# Patient Record
Sex: Female | Born: 1945 | Race: White | Hispanic: No | Marital: Married | Smoking: Former smoker
Health system: Southern US, Community
[De-identification: ages and names within clinical notes are randomized; demographics above are authoritative.]

## PROBLEM LIST (undated history)

## (undated) DIAGNOSIS — C4490 Unspecified malignant neoplasm of skin, unspecified: Secondary | ICD-10-CM

## (undated) DIAGNOSIS — I679 Cerebrovascular disease, unspecified: Secondary | ICD-10-CM

## (undated) DIAGNOSIS — I639 Cerebral infarction, unspecified: Secondary | ICD-10-CM

## (undated) DIAGNOSIS — E785 Hyperlipidemia, unspecified: Secondary | ICD-10-CM

## (undated) DIAGNOSIS — J449 Chronic obstructive pulmonary disease, unspecified: Secondary | ICD-10-CM

## (undated) DIAGNOSIS — I10 Essential (primary) hypertension: Secondary | ICD-10-CM

## (undated) DIAGNOSIS — F329 Major depressive disorder, single episode, unspecified: Secondary | ICD-10-CM

## (undated) DIAGNOSIS — M1990 Unspecified osteoarthritis, unspecified site: Secondary | ICD-10-CM

## (undated) DIAGNOSIS — C3491 Malignant neoplasm of unspecified part of right bronchus or lung: Secondary | ICD-10-CM

## (undated) DIAGNOSIS — F419 Anxiety disorder, unspecified: Secondary | ICD-10-CM

## (undated) DIAGNOSIS — J45909 Unspecified asthma, uncomplicated: Secondary | ICD-10-CM

## (undated) DIAGNOSIS — F32A Depression, unspecified: Secondary | ICD-10-CM

## (undated) HISTORY — DX: Major depressive disorder, single episode, unspecified: F32.9

## (undated) HISTORY — DX: Unspecified osteoarthritis, unspecified site: M19.90

## (undated) HISTORY — DX: Anxiety disorder, unspecified: F41.9

## (undated) HISTORY — DX: Unspecified asthma, uncomplicated: J45.909

## (undated) HISTORY — DX: Depression, unspecified: F32.A

## (undated) HISTORY — DX: Chronic obstructive pulmonary disease, unspecified: J44.9

## (undated) HISTORY — DX: Essential (primary) hypertension: I10

## (undated) HISTORY — PX: TUBAL LIGATION: SHX77

## (undated) HISTORY — PX: HAND SURGERY: SHX662

## (undated) HISTORY — DX: Unspecified malignant neoplasm of skin, unspecified: C44.90

## (undated) HISTORY — DX: Cerebrovascular disease, unspecified: I67.9

## (undated) HISTORY — DX: Hyperlipidemia, unspecified: E78.5

## (undated) HISTORY — PX: ABDOMINAL HYSTERECTOMY: SHX81

## (undated) HISTORY — DX: Malignant neoplasm of unspecified part of right bronchus or lung: C34.91

---

## 2017-02-15 HISTORY — PX: OTHER SURGICAL HISTORY: SHX169

## 2017-02-22 DIAGNOSIS — F17218 Nicotine dependence, cigarettes, with other nicotine-induced disorders: Secondary | ICD-10-CM | POA: Diagnosis not present

## 2017-02-22 DIAGNOSIS — Z801 Family history of malignant neoplasm of trachea, bronchus and lung: Secondary | ICD-10-CM | POA: Diagnosis not present

## 2017-02-22 DIAGNOSIS — C3491 Malignant neoplasm of unspecified part of right bronchus or lung: Secondary | ICD-10-CM | POA: Diagnosis not present

## 2017-02-22 DIAGNOSIS — Z808 Family history of malignant neoplasm of other organs or systems: Secondary | ICD-10-CM | POA: Diagnosis not present

## 2017-02-22 DIAGNOSIS — Z800 Family history of malignant neoplasm of digestive organs: Secondary | ICD-10-CM | POA: Diagnosis not present

## 2017-03-08 DIAGNOSIS — G893 Neoplasm related pain (acute) (chronic): Secondary | ICD-10-CM | POA: Diagnosis not present

## 2017-03-08 DIAGNOSIS — C3411 Malignant neoplasm of upper lobe, right bronchus or lung: Secondary | ICD-10-CM | POA: Diagnosis not present

## 2017-03-23 DIAGNOSIS — E860 Dehydration: Secondary | ICD-10-CM | POA: Diagnosis not present

## 2017-03-23 DIAGNOSIS — C3411 Malignant neoplasm of upper lobe, right bronchus or lung: Secondary | ICD-10-CM | POA: Diagnosis not present

## 2017-04-06 DIAGNOSIS — C3411 Malignant neoplasm of upper lobe, right bronchus or lung: Secondary | ICD-10-CM | POA: Diagnosis not present

## 2017-05-24 DIAGNOSIS — C3411 Malignant neoplasm of upper lobe, right bronchus or lung: Secondary | ICD-10-CM | POA: Diagnosis not present

## 2017-06-01 DIAGNOSIS — C3491 Malignant neoplasm of unspecified part of right bronchus or lung: Secondary | ICD-10-CM | POA: Insufficient documentation

## 2017-06-02 ENCOUNTER — Other Ambulatory Visit: Payer: Self-pay | Admitting: *Deleted

## 2017-06-02 ENCOUNTER — Encounter: Payer: Self-pay | Admitting: Thoracic Surgery (Cardiothoracic Vascular Surgery)

## 2017-06-02 ENCOUNTER — Institutional Professional Consult (permissible substitution): Payer: Medicare HMO | Admitting: Thoracic Surgery (Cardiothoracic Vascular Surgery)

## 2017-06-02 VITALS — BP 180/98 | HR 85 | Resp 20 | Ht 68.0 in | Wt 190.0 lb

## 2017-06-02 DIAGNOSIS — C3491 Malignant neoplasm of unspecified part of right bronchus or lung: Secondary | ICD-10-CM | POA: Diagnosis not present

## 2017-06-02 DIAGNOSIS — C3411 Malignant neoplasm of upper lobe, right bronchus or lung: Secondary | ICD-10-CM

## 2017-06-02 NOTE — Progress Notes (Signed)
PCP is [name], NP Referring Provider is Marice Potter, MD  Chief Complaint  Patient presents with  . Lung Cancer    Surgical eval, Chest CT 01/06/2017,PET Scan 02/02/17, Lung BX 02/15/2017, Head CT 10/26/18PFT's 12/23/2016, Completed Chemo/Radiation      HPI: Krista Grimes is sent for consideration for surgical resection.  Krista Grimes is a 72 year old woman with a 100-pack-year history of tobacco abuse (2 packs/day from age 55 until about 6 months ago), COPD, prior stroke, arthritis, hypertension, hyperlipidemia, skin cancer, anxiety, depression and asthma.  She presented with a complaint of right chest pain radiating into the right arm back in August.  CT of the chest showed a 5 cm right upper lobe mass adjacent to the chest wall without bony destruction.  PET/CT showed this mass was hypermetabolic with an SUV of 21.  There is no evidence of regional or distant metastases.  Biopsy demonstrated squamous cell carcinoma.  She was treated with concurrent chemoradiation by Drs. Bobby Rumpf and [place] at [facility].  She tolerated treatment well without any major side effects.  She had resolution of her pain.  Follow-up CT showed a significant response to treatment.  She has a history of a stroke.  She has expressive aphasia.  She has some limitation of movement in her right hand.  She is able to ambulate without difficulty.  She denies chest pain, pressure, or tightness.  She has no history of cardiac disease.  She does have coronary atherosclerosis on her scans.  She does not get short of breath with routine activities and could walk a flight of steps without stopping.  She has not had any change in appetite or weight loss.  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  She is well aware of the pain that is possible with this type of surgery.  Zubrod Score: At the time of surgery this  patient's most appropriate activity status/level should be described as: []     0    Normal activity, no symptoms [x]     1    Restricted in physical strenuous activity but ambulatory, able to do out light work []     2    Ambulatory and capable of self care, unable to do work activities, up and about >50 % of waking hours                              []     3    Only limited self care, in bed greater than 50% of waking hours []     4    Completely disabled, no self care, confined to bed or chair []     5    Moribund    Past Medical History:  Diagnosis Date  . Anxiety   . Arthritis   . Asthma   . Cerebrovascular disease   . COPD (chronic obstructive pulmonary disease) ([place])   . Depression   . Hyperlipidemia   . Hypertension   . Skin cancer   . 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 Colon cancer Mother   . Lung cancer Father   . COPD Father   . Lung cancer Brother   . Skin cancer Brother     Social History Social  History   Tobacco Use  . Smoking status: Former Smoker    Types: Cigarettes    Last attempt to quit: 04/01/2017    Years since quitting: 0.1  Substance Use Topics  . Alcohol use: No    Frequency: Never  . Drug use: No    Current Outpatient Medications  Medication Sig Dispense Refill  . albuterol (PROVENTIL HFA;VENTOLIN HFA) 108 (90 Base) MCG/ACT inhaler Inhale into the lungs every 6 (six) hours as needed for wheezing or shortness of breath.    Marland Kitchen atorvastatin (LIPITOR) 40 MG tablet Take 40 mg by mouth daily at 6 PM.    . cholecalciferol (VITAMIN D) 1000 units tablet Take 1,000 Units by mouth daily.    . citalopram (CELEXA) 20 MG tablet Take 20 mg by mouth daily.    . clopidogrel (PLAVIX) 75 MG tablet Take 75 mg by mouth daily.    Marland Kitchen lisinopril (PRINIVIL,ZESTRIL) 30 MG tablet Take 30 mg by mouth daily.    Marland Kitchen morphine (MSIR) 15 MG tablet Take 15 mg by mouth every 6 (six) hours as needed for  severe pain.    . Omega 3 1000 MG CAPS Take by mouth daily.    Marland Kitchen oxyCODONE (OXY IR/ROXICODONE) 5 MG immediate release tablet Take 5 mg by mouth every 4 (four) hours as needed for severe pain.     No current facility-administered medications for this visit.     Allergies  Allergen Reactions  . Codeine Nausea And Vomiting    Review of Systems  Constitutional: Negative for activity change, appetite change and unexpected weight change.  HENT: Positive for dental problem (dentures).   Eyes: Negative for visual disturbance.  Respiratory: Negative for cough, shortness of breath and wheezing.   Cardiovascular: Negative for chest pain, palpitations and leg swelling.  Gastrointestinal: Negative for abdominal distention, abdominal pain and blood in stool.  Genitourinary: Negative for difficulty urinating and dysuria.  Musculoskeletal: Positive for arthralgias and joint swelling.       Right chest wall, shoulder, arm pain resolved with XRT  Neurological: Positive for speech difficulty and weakness (right hand). Negative for seizures.       Memory problems  Hematological: Negative for adenopathy. Bruises/bleeds easily.  All other systems reviewed and are negative.   BP (!) 180/98   Pulse 85   Resp 20   Ht 5\' 8"  (1.727 m)   Wt 190 lb (86.2 kg)   SpO2 97% Comment: RA  BMI 28.89 kg/m  Physical Exam  Constitutional: She is oriented to person, place, and time. She appears well-developed and well-nourished. No distress.  HENT:  Head: Normocephalic and atraumatic.  Mouth/Throat: No oropharyngeal exudate.  Eyes: Conjunctivae and EOM are normal. No scleral icterus.  Neck: Neck supple. No thyromegaly present.  Cardiovascular: Normal rate, regular rhythm and normal heart sounds. Exam reveals no gallop and no friction rub.  No murmur heard. Pulmonary/Chest: Effort normal and breath sounds normal. No respiratory distress. She has no wheezes. She has no rales.  Abdominal: Soft. She exhibits no  distension. There is no tenderness.  Musculoskeletal: She exhibits deformity (right hand, unable to close grip).  Neurological: She is alert and oriented to person, place, and time. No cranial nerve deficit. She exhibits abnormal muscle tone (right hand).  Expressive aphasia  Skin: Skin is warm and dry.  Vitals reviewed.  Diagnostic Tests: CT chest [facility] 01/06/2017 Impression 1.  5 cm pleural-parenchymal lesion right apex with cavitary component inferiorly.  Imaging features suspicious for malignancy.  PET/CT recommended to further evaluate.  ` PET/CT 02/03/2018 Impression 1.  Hypermetabolism corresponding to the partially cavitary right apical lung mass.  This is highly suspicious for primary bronchogenic carcinoma.  Given location, cavitary component, and surrounding groundglass opacity, infection (including mycobacterial) cannot be excluded but is ___less likely. 2.  No evidence of thoracic nodal or extrathoracic hypermetabolic metastasis. 3.  Coronary atherosclerosis.  Aortic atherosclerosis. 4.  Pulmonary artery enlargement suggests pulmonary arterial hypertension.  CT chest [facility] 05/21/2017 Impression 1.  Response to therapy of right apical lung lesion. 2.  No thoracic adenopathy or evidence of progressive disease. 3.  Coronary atherosclerosis.  Aortic atherosclerosis. 4.  Emphysema  I personally reviewed the CT and PET/CT images and concur with the official reports as summarized above.  Pulmonary function testing  FVC 2.74 (78%) FEV1 2.04 (78%) FEV1 1.98 (70%) postbronchodilator   Impression: Mrs. Haislip is a 72 year old woman with a history of tobacco abuse, COPD, asthma, stroke, hypertension, hyperlipidemia, anxiety, depression, and skin cancer.  She presented several months ago with right chest pain radiating into her right arm.  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  There was no bony destruction.   I discussed the case with Dr. Bobby Rumpf at that time.  We felt like her best chance was with neoadjuvant chemoradiation followed by surgical resection.  She has had a good response to treatment.  I informed the patient and her family that there is a possibility that she might not need any for cure.  However, more often than not if we stop at this point she would have recurrence.  Surgical resection improves, but by no means guarantees, her odds of survival.  She would require a right upper lobectomy.  In all likelihood, we will need to do a thoracotomy and chest wall resection.  I discussed the general nature of the procedure including the incisions to be used, the need for general anesthesia, the use of drainage tubes postoperatively, the expected hospital stay, and the overall recovery.  They understand that chronic pain is a very strong possibility.  I informed him of the indications, risks, benefits, and alternatives.  They understand the risk include, but are not limited to death, MI, stroke, DVT, PE, bleeding, possible need for transfusion, infection, prolonged air leak, cardiac arrhythmias, chronic pain, respiratory failure, as well as the possibility of other unforeseeable complications.    She understands and accepts the risks and wishes to proceed.  Coronary atherosclerosis-multiple cardiac risk factors including hypertension, hyperlipidemia, and tobacco abuse.  She does not have any anginal symptoms.  There is no indication for further cardiac workup prior to surgical resection.  She is at higher than normal risk for cardiac complications.  Stroke/expressive aphasia-higher than normal risk for cerebrovascular complications.  She is on Plavix.  She will need to be off that for 5 days prior to surgery.  She has had an MR of the brain which showed no evidence of metastatic disease.  Plan: Right thoracotomy, right upper lobectomy, possible chest wall resection on Thursday, 06/24/2017  Stop Plavix on  06/19/2017  [name], MD [facility] [phone]

## 2017-06-02 NOTE — H&P (View-Only) (Signed)
PCP is [name], NP Referring Provider is Marice Potter, MD  Chief Complaint  Patient presents with  . Lung Cancer    Surgical eval, Chest CT 01/06/2017,PET Scan 02/02/17, Lung BX 02/15/2017, Head CT 10/26/18PFT's 12/23/2016, Completed Chemo/Radiation      HPI: Krista Grimes is sent for consideration for surgical resection.  Krista Grimes is a 72 year old woman with a 100-pack-year history of tobacco abuse (2 packs/day from age 81 until about 6 months ago), COPD, prior stroke, arthritis, hypertension, hyperlipidemia, skin cancer, anxiety, depression and asthma.  She presented with a complaint of right chest pain radiating into the right arm back in August.  CT of the chest showed a 5 cm right upper lobe mass adjacent to the chest wall without bony destruction.  PET/CT showed this mass was hypermetabolic with an SUV of 21.  There is no evidence of regional or distant metastases.  Biopsy demonstrated squamous cell carcinoma.  She was treated with concurrent chemoradiation by Drs. Bobby Rumpf and Baxter at [facility].  She tolerated treatment well without any major side effects.  She had resolution of her pain.  Follow-up CT showed a significant response to treatment.  She has a history of a stroke.  She has expressive aphasia.  She has some limitation of movement in her right hand.  She is able to ambulate without difficulty.  She denies chest pain, pressure, or tightness.  She has no history of cardiac disease.  She does have coronary atherosclerosis on her scans.  She does not get short of breath with routine activities and could walk a flight of steps without stopping.  She has not had any change in appetite or weight loss.  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  She is well aware of the pain that is possible with this type of surgery.  Zubrod Score: At the time of surgery this  patient's most appropriate activity status/level should be described as: []     0    Normal activity, no symptoms [x]     1    Restricted in physical strenuous activity but ambulatory, able to do out light work []     2    Ambulatory and capable of self care, unable to do work activities, up and about >50 % of waking hours                              []     3    Only limited self care, in bed greater than 50% of waking hours []     4    Completely disabled, no self care, confined to bed or chair []     5    Moribund    Past Medical History:  Diagnosis Date  . Anxiety   . Arthritis   . Asthma   . Cerebrovascular disease   . COPD (chronic obstructive pulmonary disease) ([place])   . Depression   . Hyperlipidemia   . Hypertension   . Skin cancer   . 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 Colon cancer Mother   . Lung cancer Father   . COPD Father   . Lung cancer Brother   . Skin cancer Brother     Social History Social  History   Tobacco Use  . Smoking status: Former Smoker    Types: Cigarettes    Last attempt to quit: 04/01/2017    Years since quitting: 0.1  Substance Use Topics  . Alcohol use: No    Frequency: Never  . Drug use: No    Current Outpatient Medications  Medication Sig Dispense Refill  . albuterol (PROVENTIL HFA;VENTOLIN HFA) 108 (90 Base) MCG/ACT inhaler Inhale into the lungs every 6 (six) hours as needed for wheezing or shortness of breath.    Marland Kitchen atorvastatin (LIPITOR) 40 MG tablet Take 40 mg by mouth daily at 6 PM.    . cholecalciferol (VITAMIN D) 1000 units tablet Take 1,000 Units by mouth daily.    . citalopram (CELEXA) 20 MG tablet Take 20 mg by mouth daily.    . clopidogrel (PLAVIX) 75 MG tablet Take 75 mg by mouth daily.    Marland Kitchen lisinopril (PRINIVIL,ZESTRIL) 30 MG tablet Take 30 mg by mouth daily.    Marland Kitchen morphine (MSIR) 15 MG tablet Take 15 mg by mouth every 6 (six) hours as needed for  severe pain.    . Omega 3 1000 MG CAPS Take by mouth daily.    Marland Kitchen oxyCODONE (OXY IR/ROXICODONE) 5 MG immediate release tablet Take 5 mg by mouth every 4 (four) hours as needed for severe pain.     No current facility-administered medications for this visit.     Allergies  Allergen Reactions  . Codeine Nausea And Vomiting    Review of Systems  Constitutional: Negative for activity change, appetite change and unexpected weight change.  HENT: Positive for dental problem (dentures).   Eyes: Negative for visual disturbance.  Respiratory: Negative for cough, shortness of breath and wheezing.   Cardiovascular: Negative for chest pain, palpitations and leg swelling.  Gastrointestinal: Negative for abdominal distention, abdominal pain and blood in stool.  Genitourinary: Negative for difficulty urinating and dysuria.  Musculoskeletal: Positive for arthralgias and joint swelling.       Right chest wall, shoulder, arm pain resolved with XRT  Neurological: Positive for speech difficulty and weakness (right hand). Negative for seizures.       Memory problems  Hematological: Negative for adenopathy. Bruises/bleeds easily.  All other systems reviewed and are negative.   BP (!) 180/98   Pulse 85   Resp 20   Ht 5\' 8"  (1.727 m)   Wt 190 lb (86.2 kg)   SpO2 97% Comment: RA  BMI 28.89 kg/m  Physical Exam  Constitutional: She is oriented to person, place, and time. She appears well-developed and well-nourished. No distress.  HENT:  Head: Normocephalic and atraumatic.  Mouth/Throat: No oropharyngeal exudate.  Eyes: Conjunctivae and EOM are normal. No scleral icterus.  Neck: Neck supple. No thyromegaly present.  Cardiovascular: Normal rate, regular rhythm and normal heart sounds. Exam reveals no gallop and no friction rub.  No murmur heard. Pulmonary/Chest: Effort normal and breath sounds normal. No respiratory distress. She has no wheezes. She has no rales.  Abdominal: Soft. She exhibits no  distension. There is no tenderness.  Musculoskeletal: She exhibits deformity (right hand, unable to close grip).  Neurological: She is alert and oriented to person, place, and time. No cranial nerve deficit. She exhibits abnormal muscle tone (right hand).  Expressive aphasia  Skin: Skin is warm and dry.  Vitals reviewed.  Diagnostic Tests: CT chest [facility] 01/06/2017 Impression 1.  5 cm pleural-parenchymal lesion right apex with cavitary component inferiorly.  Imaging features suspicious for malignancy.  PET/CT recommended to further evaluate.  ` PET/CT 02/03/2018 Impression 1.  Hypermetabolism corresponding to the partially cavitary right apical lung mass.  This is highly suspicious for primary bronchogenic carcinoma.  Given location, cavitary component, and surrounding groundglass opacity, infection (including mycobacterial) cannot be excluded but is ___less likely. 2.  No evidence of thoracic nodal or extrathoracic hypermetabolic metastasis. 3.  Coronary atherosclerosis.  Aortic atherosclerosis. 4.  Pulmonary artery enlargement suggests pulmonary arterial hypertension.  CT chest [facility] 05/21/2017 Impression 1.  Response to therapy of right apical lung lesion. 2.  No thoracic adenopathy or evidence of progressive disease. 3.  Coronary atherosclerosis.  Aortic atherosclerosis. 4.  Emphysema  I personally reviewed the CT and PET/CT images and concur with the official reports as summarized above.  Pulmonary function testing  FVC 2.74 (78%) FEV1 2.04 (78%) FEV1 1.98 (70%) postbronchodilator   Impression: Krista Grimes is a 72 year old woman with a history of tobacco abuse, COPD, asthma, stroke, hypertension, hyperlipidemia, anxiety, depression, and skin cancer.  She presented several months ago with right chest pain radiating into her right arm.  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  There was no bony destruction.   I discussed the case with Dr. Bobby Rumpf at that time.  We felt like her best chance was with neoadjuvant chemoradiation followed by surgical resection.  She has had a good response to treatment.  I informed the patient and her family that there is a possibility that she might not need any for cure.  However, more often than not if we stop at this point she would have recurrence.  Surgical resection improves, but by no means guarantees, her odds of survival.  She would require a right upper lobectomy.  In all likelihood, we will need to do a thoracotomy and chest wall resection.  I discussed the general nature of the procedure including the incisions to be used, the need for general anesthesia, the use of drainage tubes postoperatively, the expected hospital stay, and the overall recovery.  They understand that chronic pain is a very strong possibility.  I informed him of the indications, risks, benefits, and alternatives.  They understand the risk include, but are not limited to death, MI, stroke, DVT, PE, bleeding, possible need for transfusion, infection, prolonged air leak, cardiac arrhythmias, chronic pain, respiratory failure, as well as the possibility of other unforeseeable complications.    She understands and accepts the risks and wishes to proceed.  Coronary atherosclerosis-multiple cardiac risk factors including hypertension, hyperlipidemia, and tobacco abuse.  She does not have any anginal symptoms.  There is no indication for further cardiac workup prior to surgical resection.  She is at higher than normal risk for cardiac complications.  Stroke/expressive aphasia-higher than normal risk for cerebrovascular complications.  She is on Plavix.  She will need to be off that for 5 days prior to surgery.  She has had an MR of the brain which showed no evidence of metastatic disease.  Plan: Right thoracotomy, right upper lobectomy, possible chest wall resection on Thursday, 06/24/2017  Stop Plavix on  06/19/2017  [name], MD [facility] [phone]

## 2017-06-14 LAB — PULMONARY FUNCTION TEST

## 2017-06-22 ENCOUNTER — Encounter (HOSPITAL_COMMUNITY)
Admission: RE | Admit: 2017-06-22 | Discharge: 2017-06-22 | Disposition: A | Discharge status: Home / Self Care | Payer: Medicare HMO | Source: Ambulatory Visit | Attending: Thoracic Surgery (Cardiothoracic Vascular Surgery) | Admitting: Thoracic Surgery (Cardiothoracic Vascular Surgery)

## 2017-06-22 ENCOUNTER — Ambulatory Visit (HOSPITAL_COMMUNITY)
Admission: RE | Admit: 2017-06-22 | Discharge: 2017-06-22 | Disposition: A | Discharge status: Home / Self Care | Payer: Medicare HMO | Source: Ambulatory Visit | Attending: Thoracic Surgery (Cardiothoracic Vascular Surgery) | Admitting: Thoracic Surgery (Cardiothoracic Vascular Surgery)

## 2017-06-22 ENCOUNTER — Encounter (HOSPITAL_COMMUNITY): Payer: Self-pay | Admitting: *Deleted

## 2017-06-22 DIAGNOSIS — C3411 Malignant neoplasm of upper lobe, right bronchus or lung: Secondary | ICD-10-CM | POA: Insufficient documentation

## 2017-06-22 DIAGNOSIS — Z01818 Encounter for other preprocedural examination: Secondary | ICD-10-CM | POA: Insufficient documentation

## 2017-06-22 DIAGNOSIS — I700 Atherosclerosis of aorta: Secondary | ICD-10-CM

## 2017-06-22 DIAGNOSIS — J439 Emphysema, unspecified: Secondary | ICD-10-CM

## 2017-06-22 HISTORY — DX: Cerebral infarction, unspecified: I63.9

## 2017-06-22 LAB — BLOOD GAS, ARTERIAL
Acid-base deficit: 0.6 mmol/L (ref 0.0–2.0)
Bicarbonate: 23.1 mmol/L (ref 20.0–28.0)
DRAWN BY: 421801
FIO2: 0.21
O2 SAT: 97.8 %
PATIENT TEMPERATURE: 98.6
pCO2 arterial: 35.1 mmHg (ref 32.0–48.0)
pH, Arterial: 7.434 (ref 7.350–7.450)
pO2, Arterial: 103 mmHg (ref 83.0–108.0)

## 2017-06-22 LAB — PROTIME-INR
INR: 1.01
Prothrombin Time: 13.2 seconds (ref 11.4–15.2)

## 2017-06-22 LAB — CBC
HCT: 39.9 % (ref 36.0–46.0)
Hemoglobin: 12.9 g/dL (ref 12.0–15.0)
MCH: 32.1 pg (ref 26.0–34.0)
MCHC: 32.3 g/dL (ref 30.0–36.0)
MCV: 99.3 fL (ref 78.0–100.0)
PLATELETS: 220 10*3/uL (ref 150–400)
RBC: 4.02 MIL/uL (ref 3.87–5.11)
RDW: 14.5 % (ref 11.5–15.5)
WBC: 6.8 10*3/uL (ref 4.0–10.5)

## 2017-06-22 LAB — COMPREHENSIVE METABOLIC PANEL
ALBUMIN: 4.2 g/dL (ref 3.5–5.0)
ALT: 15 U/L (ref 14–54)
AST: 21 U/L (ref 15–41)
Alkaline Phosphatase: 80 U/L (ref 38–126)
Anion gap: 15 (ref 5–15)
BUN: 11 mg/dL (ref 6–20)
CHLORIDE: 108 mmol/L (ref 101–111)
CO2: 19 mmol/L — AB (ref 22–32)
CREATININE: 0.63 mg/dL (ref 0.44–1.00)
Calcium: 9.5 mg/dL (ref 8.9–10.3)
GFR calc non Af Amer: >60 mL/min (ref >60)
Glucose, Bld: 107 mg/dL — ABNORMAL HIGH (ref 65–99)
Potassium: 4 mmol/L (ref 3.5–5.1)
SODIUM: 142 mmol/L (ref 135–145)
Total Bilirubin: 0.8 mg/dL (ref 0.3–1.2)
Total Protein: 7.1 g/dL (ref 6.5–8.1)

## 2017-06-22 LAB — URINALYSIS, ROUTINE W REFLEX MICROSCOPIC
Bilirubin Urine: NEGATIVE
GLUCOSE, UA: NEGATIVE mg/dL
KETONES UR: NEGATIVE mg/dL
LEUKOCYTES UA: NEGATIVE
Nitrite: NEGATIVE
PH: 7 (ref 5.0–8.0)
Protein, ur: NEGATIVE mg/dL
Specific Gravity, Urine: 1.002 — ABNORMAL LOW (ref 1.005–1.030)

## 2017-06-22 LAB — ABO/RH: ABO/RH(D): A POS

## 2017-06-22 LAB — SURGICAL PCR SCREEN
MRSA, PCR: INVALID — AB
STAPHYLOCOCCUS AUREUS: INVALID — AB

## 2017-06-22 LAB — APTT: aPTT: 29 seconds (ref 24–36)

## 2017-06-22 NOTE — Pre-Procedure Instructions (Signed)
    Krista Grimes  06/22/2017      [id number] - [place], [place] - [address] [name] [address] Phone: [phone] Fax: [phone]    Your procedure is scheduled on 06/24/17.  Report to [facility] Admitting at 6 A.M.  Call this number if you have problems the morning of surgery:  [phone]   Remember:  Do not eat food or drink liquids after midnight.  Take these medicines the morning of surgery with A SIP OF WATER --all inhalers,lexapro   Do not wear jewelry, make-up or nail polish.  Do not wear lotions, powders, or perfumes, or deodorant.  Do not shave 48 hours prior to surgery.  Men may shave face and neck.  Do not bring valuables to the hospital.  [facility] is not responsible for any belongings or valuables.  Contacts, dentures or bridgework may not be worn into surgery.  Leave your suitcase in the car.  After surgery it may be brought to your room.  For patients admitted to the hospital, discharge time will be determined by your treatment team.  Patients discharged the day of surgery will not be allowed to drive home.   Name and phone number of your driver:    Special instructions:  Do not take any aspirin,anti-inflammatories,vitamins,or herbal supplements 5-7 days prior to surgery.  Please read over the following fact sheets that you were given. MRSA Information

## 2017-06-23 LAB — MRSA CULTURE: Culture: NOT DETECTED

## 2017-06-24 ENCOUNTER — Inpatient Hospital Stay (HOSPITAL_COMMUNITY): Payer: Medicare HMO

## 2017-06-24 ENCOUNTER — Inpatient Hospital Stay (HOSPITAL_COMMUNITY): Payer: Medicare HMO | Admitting: Anesthesiology

## 2017-06-24 ENCOUNTER — Encounter (HOSPITAL_COMMUNITY)
Admission: RE | Disposition: A | Discharge status: Home / Self Care | Payer: Self-pay | Source: Ambulatory Visit | Attending: Thoracic Surgery (Cardiothoracic Vascular Surgery)

## 2017-06-24 ENCOUNTER — Encounter (HOSPITAL_COMMUNITY): Payer: Self-pay | Admitting: Urology

## 2017-06-24 ENCOUNTER — Inpatient Hospital Stay (HOSPITAL_COMMUNITY)
Admission: RE | Admit: 2017-06-24 | Discharge: 2017-07-01 | DRG: 164 | Disposition: A | Discharge status: Home / Self Care | Payer: Medicare HMO | Source: Ambulatory Visit | Attending: Thoracic Surgery (Cardiothoracic Vascular Surgery) | Admitting: Thoracic Surgery (Cardiothoracic Vascular Surgery)

## 2017-06-24 DIAGNOSIS — J9382 Other air leak: Secondary | ICD-10-CM | POA: Diagnosis not present

## 2017-06-24 DIAGNOSIS — Z9689 Presence of other specified functional implants: Secondary | ICD-10-CM

## 2017-06-24 DIAGNOSIS — I2510 Atherosclerotic heart disease of native coronary artery without angina pectoris: Secondary | ICD-10-CM | POA: Diagnosis present

## 2017-06-24 DIAGNOSIS — I69320 Aphasia following cerebral infarction: Secondary | ICD-10-CM

## 2017-06-24 DIAGNOSIS — Z808 Family history of malignant neoplasm of other organs or systems: Secondary | ICD-10-CM | POA: Diagnosis not present

## 2017-06-24 DIAGNOSIS — E785 Hyperlipidemia, unspecified: Secondary | ICD-10-CM | POA: Diagnosis present

## 2017-06-24 DIAGNOSIS — Z923 Personal history of irradiation: Secondary | ICD-10-CM

## 2017-06-24 DIAGNOSIS — Z85828 Personal history of other malignant neoplasm of skin: Secondary | ICD-10-CM | POA: Diagnosis not present

## 2017-06-24 DIAGNOSIS — Z885 Allergy status to narcotic agent status: Secondary | ICD-10-CM

## 2017-06-24 DIAGNOSIS — I10 Essential (primary) hypertension: Secondary | ICD-10-CM | POA: Diagnosis present

## 2017-06-24 DIAGNOSIS — Z801 Family history of malignant neoplasm of trachea, bronchus and lung: Secondary | ICD-10-CM | POA: Diagnosis not present

## 2017-06-24 DIAGNOSIS — Z9221 Personal history of antineoplastic chemotherapy: Secondary | ICD-10-CM | POA: Diagnosis not present

## 2017-06-24 DIAGNOSIS — Z902 Acquired absence of lung [part of]: Secondary | ICD-10-CM

## 2017-06-24 DIAGNOSIS — Z7902 Long term (current) use of antithrombotics/antiplatelets: Secondary | ICD-10-CM

## 2017-06-24 DIAGNOSIS — Z90710 Acquired absence of both cervix and uterus: Secondary | ICD-10-CM | POA: Diagnosis not present

## 2017-06-24 DIAGNOSIS — F329 Major depressive disorder, single episode, unspecified: Secondary | ICD-10-CM | POA: Diagnosis present

## 2017-06-24 DIAGNOSIS — I480 Paroxysmal atrial fibrillation: Secondary | ICD-10-CM | POA: Diagnosis not present

## 2017-06-24 DIAGNOSIS — R002 Palpitations: Secondary | ICD-10-CM | POA: Diagnosis present

## 2017-06-24 DIAGNOSIS — E876 Hypokalemia: Secondary | ICD-10-CM | POA: Diagnosis present

## 2017-06-24 DIAGNOSIS — J449 Chronic obstructive pulmonary disease, unspecified: Secondary | ICD-10-CM | POA: Diagnosis present

## 2017-06-24 DIAGNOSIS — R339 Retention of urine, unspecified: Secondary | ICD-10-CM | POA: Diagnosis not present

## 2017-06-24 DIAGNOSIS — F17210 Nicotine dependence, cigarettes, uncomplicated: Secondary | ICD-10-CM | POA: Diagnosis present

## 2017-06-24 DIAGNOSIS — C3411 Malignant neoplasm of upper lobe, right bronchus or lung: Principal | ICD-10-CM | POA: Diagnosis present

## 2017-06-24 DIAGNOSIS — Z79899 Other long term (current) drug therapy: Secondary | ICD-10-CM | POA: Diagnosis not present

## 2017-06-24 DIAGNOSIS — J939 Pneumothorax, unspecified: Secondary | ICD-10-CM

## 2017-06-24 DIAGNOSIS — Z8673 Personal history of transient ischemic attack (TIA), and cerebral infarction without residual deficits: Secondary | ICD-10-CM | POA: Diagnosis not present

## 2017-06-24 HISTORY — PX: LOBECTOMY: SHX5089

## 2017-06-24 HISTORY — PX: THORACOTOMY: SHX5074

## 2017-06-24 LAB — POCT I-STAT 7, (LYTES, BLD GAS, ICA,H+H)
ACID-BASE EXCESS: 1 mmol/L (ref 0.0–2.0)
Bicarbonate: 25.4 mmol/L (ref 20.0–28.0)
CALCIUM ION: 1.04 mmol/L — AB (ref 1.15–1.40)
HCT: 27 % — ABNORMAL LOW (ref 36.0–46.0)
HEMOGLOBIN: 9.2 g/dL — AB (ref 12.0–15.0)
O2 SAT: 100 %
PH ART: 7.399 (ref 7.350–7.450)
Potassium: 3 mmol/L — ABNORMAL LOW (ref 3.5–5.1)
SODIUM: 145 mmol/L (ref 135–145)
TCO2: 27 mmol/L (ref 22–32)
pCO2 arterial: 41.1 mmHg (ref 32.0–48.0)
pO2, Arterial: 275 mmHg — ABNORMAL HIGH (ref 83.0–108.0)

## 2017-06-24 LAB — PREPARE RBC (CROSSMATCH)

## 2017-06-24 LAB — GLUCOSE, CAPILLARY
Glucose-Capillary: 219 mg/dL — ABNORMAL HIGH (ref 65–99)
Glucose-Capillary: 191 mg/dL — ABNORMAL HIGH (ref 65–99)

## 2017-06-24 SURGERY — THORACOTOMY, MAJOR
Anesthesia: General | Site: Chest | Laterality: Right

## 2017-06-24 MED ORDER — VANCOMYCIN HCL 1000 MG IV SOLR
INTRAVENOUS | Status: AC
  Filled 2017-06-24: qty 1000

## 2017-06-24 MED ORDER — HYDROMORPHONE HCL 1 MG/ML IJ SOLN: 0.2500 mg | INTRAMUSCULAR | Status: DC | PRN

## 2017-06-24 MED ORDER — OXYCODONE HCL 5 MG/5ML PO SOLN: 5.0000 mg | Freq: Once | ORAL | Status: DC | PRN

## 2017-06-24 MED ORDER — LACTATED RINGERS IV SOLN
INTRAVENOUS | Status: DC | PRN
  Administered 2017-06-24 (×2): via INTRAVENOUS

## 2017-06-24 MED ORDER — ALBUTEROL SULFATE (2.5 MG/3ML) 0.083% IN NEBU: 3.0000 mL | INHALATION_SOLUTION | Freq: Four times a day (QID) | RESPIRATORY_TRACT | Status: DC | PRN

## 2017-06-24 MED ORDER — BUPIVACAINE HCL (PF) 0.5 % IJ SOLN
INTRAMUSCULAR | Status: AC
  Filled 2017-06-24: qty 10

## 2017-06-24 MED ORDER — PROPOFOL 10 MG/ML IV BOLUS
INTRAVENOUS | Status: AC
  Filled 2017-06-24: qty 20

## 2017-06-24 MED ORDER — OXYCODONE HCL 5 MG PO TABS
5.0000 mg | ORAL_TABLET | ORAL | Status: DC | PRN
  Administered 2017-06-25: 5 mg via ORAL
  Administered 2017-06-25 (×2): 10 mg via ORAL
  Administered 2017-06-25: 5 mg via ORAL
  Filled 2017-06-24: qty 1
  Filled 2017-06-24 (×2): qty 2
  Filled 2017-06-24: qty 1

## 2017-06-24 MED ORDER — FENTANYL CITRATE (PF) 250 MCG/5ML IJ SOLN
INTRAMUSCULAR | Status: AC
  Filled 2017-06-24: qty 5

## 2017-06-24 MED ORDER — BUPIVACAINE HCL 0.5 % IJ SOLN
INTRAMUSCULAR | Status: DC | PRN
  Administered 2017-06-24: 9 mL

## 2017-06-24 MED ORDER — BUPIVACAINE 0.5 % ON-Q PUMP SINGLE CATH 400 ML
INJECTION | Status: AC | PRN
  Administered 2017-06-24: 400 mL

## 2017-06-24 MED ORDER — SUGAMMADEX SODIUM 500 MG/5ML IV SOLN
INTRAVENOUS | Status: DC | PRN
  Administered 2017-06-24: 200 mg via INTRAVENOUS

## 2017-06-24 MED ORDER — 0.9 % SODIUM CHLORIDE (POUR BTL) OPTIME
TOPICAL | Status: DC | PRN
  Administered 2017-06-24: 1000 mL

## 2017-06-24 MED ORDER — HYDRALAZINE HCL 20 MG/ML IJ SOLN
INTRAMUSCULAR | Status: AC
  Filled 2017-06-24: qty 1

## 2017-06-24 MED ORDER — MIDAZOLAM HCL 2 MG/2ML IJ SOLN
INTRAMUSCULAR | Status: AC
  Filled 2017-06-24: qty 2

## 2017-06-24 MED ORDER — ESCITALOPRAM OXALATE 20 MG PO TABS
20.0000 mg | ORAL_TABLET | Freq: Every day | ORAL | Status: DC
  Administered 2017-06-25 – 2017-07-01 (×7): 20 mg via ORAL
  Filled 2017-06-24 (×5): qty 2
  Filled 2017-06-24: qty 1
  Filled 2017-06-24 (×2): qty 2

## 2017-06-24 MED ORDER — HYDRALAZINE HCL 20 MG/ML IJ SOLN
10.0000 mg | Freq: Four times a day (QID) | INTRAMUSCULAR | Status: DC | PRN
  Administered 2017-06-24: 10 mg via INTRAVENOUS
  Filled 2017-06-24: qty 1

## 2017-06-24 MED ORDER — SODIUM CHLORIDE 0.9% FLUSH: 9.0000 mL | INTRAVENOUS | Status: DC | PRN

## 2017-06-24 MED ORDER — ONDANSETRON HCL 4 MG/2ML IJ SOLN
INTRAMUSCULAR | Status: DC | PRN
  Administered 2017-06-24: 4 mg via INTRAVENOUS

## 2017-06-24 MED ORDER — POTASSIUM CHLORIDE 10 MEQ/50ML IV SOLN
10.0000 meq | Freq: Every day | INTRAVENOUS | Status: DC | PRN
  Filled 2017-06-24: qty 50

## 2017-06-24 MED ORDER — LACTATED RINGERS IV SOLN
INTRAVENOUS | Status: DC
Start: 2017-06-24 — End: 2017-06-24
  Administered 2017-06-24 (×3): via INTRAVENOUS

## 2017-06-24 MED ORDER — DEXTROSE 5 % IV SOLN
INTRAVENOUS | Status: DC | PRN
  Administered 2017-06-24: 20 ug/min via INTRAVENOUS

## 2017-06-24 MED ORDER — INSULIN ASPART 100 UNIT/ML ~~LOC~~ SOLN
0.0000 [IU] | SUBCUTANEOUS | Status: DC
  Administered 2017-06-24: 4 [IU] via SUBCUTANEOUS
  Administered 2017-06-24: 8 [IU] via SUBCUTANEOUS
  Administered 2017-06-25: 4 [IU] via SUBCUTANEOUS
  Administered 2017-06-25: 2 [IU] via SUBCUTANEOUS

## 2017-06-24 MED ORDER — ATORVASTATIN CALCIUM 40 MG PO TABS
40.0000 mg | ORAL_TABLET | Freq: Every day | ORAL | Status: DC
  Administered 2017-06-25 – 2017-06-30 (×6): 40 mg via ORAL
  Filled 2017-06-24 (×6): qty 1

## 2017-06-24 MED ORDER — ROCURONIUM BROMIDE 100 MG/10ML IV SOLN
INTRAVENOUS | Status: DC | PRN
  Administered 2017-06-24: 50 mg via INTRAVENOUS
  Administered 2017-06-24 (×3): 20 mg via INTRAVENOUS
  Administered 2017-06-24 (×2): 10 mg via INTRAVENOUS

## 2017-06-24 MED ORDER — OXYCODONE HCL 5 MG PO TABS: 5.0000 mg | ORAL_TABLET | Freq: Once | ORAL | Status: DC | PRN

## 2017-06-24 MED ORDER — MIDAZOLAM HCL 5 MG/5ML IJ SOLN
INTRAMUSCULAR | Status: DC | PRN
  Administered 2017-06-24: 1 mg via INTRAVENOUS

## 2017-06-24 MED ORDER — LIDOCAINE HCL (CARDIAC) 20 MG/ML IV SOLN
INTRAVENOUS | Status: DC | PRN
  Administered 2017-06-24: 60 mg via INTRAVENOUS

## 2017-06-24 MED ORDER — ORAL CARE MOUTH RINSE
15.0000 mL | Freq: Two times a day (BID) | OROMUCOSAL | Status: DC
  Administered 2017-06-24 – 2017-06-27 (×6): 15 mL via OROMUCOSAL

## 2017-06-24 MED ORDER — DEXMEDETOMIDINE HCL 200 MCG/2ML IV SOLN
INTRAVENOUS | Status: DC | PRN
  Administered 2017-06-24: 8 ug via INTRAVENOUS
  Administered 2017-06-24 (×2): 12 ug via INTRAVENOUS
  Administered 2017-06-24: 8 ug via INTRAVENOUS
  Administered 2017-06-24: 12 ug via INTRAVENOUS
  Administered 2017-06-24: 8 ug via INTRAVENOUS

## 2017-06-24 MED ORDER — MEPERIDINE HCL 50 MG/ML IJ SOLN: 6.2500 mg | INTRAMUSCULAR | Status: DC | PRN

## 2017-06-24 MED ORDER — FENTANYL 40 MCG/ML IV SOLN
INTRAVENOUS | Status: DC
  Administered 2017-06-24: 100 ug via INTRAVENOUS
  Administered 2017-06-24: 1000 ug via INTRAVENOUS
  Administered 2017-06-25: 15 ug via INTRAVENOUS
  Administered 2017-06-25: 45 ug via INTRAVENOUS

## 2017-06-24 MED ORDER — ONDANSETRON HCL 4 MG/2ML IJ SOLN
4.0000 mg | Freq: Four times a day (QID) | INTRAMUSCULAR | Status: DC | PRN
  Administered 2017-06-25: 4 mg via INTRAVENOUS
  Filled 2017-06-24: qty 2

## 2017-06-24 MED ORDER — SODIUM CHLORIDE 0.9 % IV SOLN: Freq: Once | INTRAVENOUS | Status: DC

## 2017-06-24 MED ORDER — METOPROLOL TARTRATE 5 MG/5ML IV SOLN
INTRAVENOUS | Status: DC | PRN
  Administered 2017-06-24: 1 mg via INTRAVENOUS

## 2017-06-24 MED ORDER — BUPIVACAINE 0.5 % ON-Q PUMP SINGLE CATH 400 ML
400.0000 mL | INJECTION | Status: DC
  Filled 2017-06-24: qty 400

## 2017-06-24 MED ORDER — DIPHENHYDRAMINE HCL 12.5 MG/5ML PO ELIX: 12.5000 mg | ORAL_SOLUTION | Freq: Four times a day (QID) | ORAL | Status: DC | PRN

## 2017-06-24 MED ORDER — TRAMADOL HCL 50 MG PO TABS
50.0000 mg | ORAL_TABLET | Freq: Four times a day (QID) | ORAL | Status: DC | PRN
  Administered 2017-06-24 – 2017-07-01 (×3): 100 mg via ORAL
  Filled 2017-06-24 (×3): qty 2

## 2017-06-24 MED ORDER — DIPHENHYDRAMINE HCL 50 MG/ML IJ SOLN: 12.5000 mg | Freq: Four times a day (QID) | INTRAMUSCULAR | Status: DC | PRN

## 2017-06-24 MED ORDER — FENTANYL CITRATE (PF) 100 MCG/2ML IJ SOLN
INTRAMUSCULAR | Status: AC
  Filled 2017-06-24: qty 2

## 2017-06-24 MED ORDER — ACETAMINOPHEN 500 MG PO TABS
1000.0000 mg | ORAL_TABLET | Freq: Four times a day (QID) | ORAL | Status: AC
  Administered 2017-06-24 – 2017-06-29 (×19): 1000 mg via ORAL
  Filled 2017-06-24 (×19): qty 2

## 2017-06-24 MED ORDER — ALBUMIN HUMAN 5 % IV SOLN
INTRAVENOUS | Status: DC | PRN
Start: 2017-06-24 — End: 2017-06-24
  Administered 2017-06-24: 13:00:00 via INTRAVENOUS

## 2017-06-24 MED ORDER — DEXTROSE-NACL 5-0.45 % IV SOLN
INTRAVENOUS | Status: DC
  Administered 2017-06-24 – 2017-06-25 (×2): via INTRAVENOUS

## 2017-06-24 MED ORDER — DEXTROSE 5 % IV SOLN
1.5000 g | INTRAVENOUS | Status: AC
  Administered 2017-06-24: 1.5 g via INTRAVENOUS

## 2017-06-24 MED ORDER — FENTANYL CITRATE (PF) 100 MCG/2ML IJ SOLN: 25.0000 ug | INTRAMUSCULAR | Status: DC | PRN

## 2017-06-24 MED ORDER — SENNOSIDES-DOCUSATE SODIUM 8.6-50 MG PO TABS
1.0000 | ORAL_TABLET | Freq: Every day | ORAL | Status: DC
  Administered 2017-06-24 – 2017-06-28 (×5): 1 via ORAL
  Filled 2017-06-24 (×6): qty 1

## 2017-06-24 MED ORDER — ENOXAPARIN SODIUM 40 MG/0.4ML ~~LOC~~ SOLN
40.0000 mg | Freq: Every day | SUBCUTANEOUS | Status: DC
  Administered 2017-06-25 – 2017-06-29 (×5): 40 mg via SUBCUTANEOUS
  Filled 2017-06-24 (×6): qty 0.4

## 2017-06-24 MED ORDER — VANCOMYCIN HCL IN DEXTROSE 1-5 GM/200ML-% IV SOLN
1000.0000 mg | Freq: Two times a day (BID) | INTRAVENOUS | Status: AC
  Administered 2017-06-24: 1000 mg via INTRAVENOUS
  Filled 2017-06-24: qty 200

## 2017-06-24 MED ORDER — ACETAMINOPHEN 160 MG/5ML PO SOLN: 1000.0000 mg | Freq: Four times a day (QID) | ORAL | Status: DC

## 2017-06-24 MED ORDER — PROMETHAZINE HCL 25 MG/ML IJ SOLN: 6.2500 mg | INTRAMUSCULAR | Status: DC | PRN

## 2017-06-24 MED ORDER — BISACODYL 5 MG PO TBEC
10.0000 mg | DELAYED_RELEASE_TABLET | Freq: Every day | ORAL | Status: DC
  Administered 2017-06-25 – 2017-06-29 (×5): 10 mg via ORAL
  Filled 2017-06-24 (×6): qty 2

## 2017-06-24 MED ORDER — PROPOFOL 10 MG/ML IV BOLUS
INTRAVENOUS | Status: DC | PRN
  Administered 2017-06-24: 130 mg via INTRAVENOUS

## 2017-06-24 MED ORDER — DEXTROSE 5 % IV SOLN
1.5000 g | Freq: Two times a day (BID) | INTRAVENOUS | Status: AC
  Administered 2017-06-24 – 2017-06-25 (×2): 1.5 g via INTRAVENOUS
  Filled 2017-06-24 (×2): qty 1.5

## 2017-06-24 MED ORDER — DEXAMETHASONE SODIUM PHOSPHATE 10 MG/ML IJ SOLN
INTRAMUSCULAR | Status: DC | PRN
  Administered 2017-06-24: 10 mg via INTRAVENOUS

## 2017-06-24 MED ORDER — NALOXONE HCL 0.4 MG/ML IJ SOLN: 0.4000 mg | INTRAMUSCULAR | Status: DC | PRN

## 2017-06-24 MED ORDER — FENTANYL CITRATE (PF) 100 MCG/2ML IJ SOLN
INTRAMUSCULAR | Status: DC | PRN
  Administered 2017-06-24 (×8): 50 ug via INTRAVENOUS
  Administered 2017-06-24: 100 ug via INTRAVENOUS
  Administered 2017-06-24 (×4): 50 ug via INTRAVENOUS

## 2017-06-24 MED ORDER — DEXTROSE 5 % IV SOLN
1.5000 g | INTRAVENOUS | Status: AC
  Administered 2017-06-24: 1.5 g via INTRAVENOUS
  Filled 2017-06-24: qty 1.5

## 2017-06-24 MED ORDER — DEXTROSE 5 % IV SOLN
INTRAVENOUS | Status: AC
  Filled 2017-06-24: qty 1.5

## 2017-06-24 SURGICAL SUPPLY — 124 items
APPLICATOR TIP EXT COSEAL (VASCULAR PRODUCTS) IMPLANT
APPLIER CLIP LOGIC TI 5 (MISCELLANEOUS) ×4 IMPLANT
APPLIER CLIP ROT 10 11.4 M/L (STAPLE)
BIT DRILL 7/64X5 DISP (BIT) ×4 IMPLANT
BLADE OSCILLATING /SAGITTAL (BLADE) ×4 IMPLANT
BLADE SAW GIGLI 510 (BLADE) ×3 IMPLANT
BLADE SAW GIGLI 510MM (BLADE) ×1
BOWL SMART MIX CTS (DISPOSABLE) IMPLANT
CANISTER SUCT 3000ML PPV (MISCELLANEOUS) ×8 IMPLANT
CATH HYDRAGLIDE XL THORACIC (CATHETERS) IMPLANT
CATH KIT ON Q 5IN SLV (PAIN MANAGEMENT) IMPLANT
CATH KIT ON-Q SILVERSOAK 5IN (CATHETERS) ×4 IMPLANT
CATH ROBINSON RED A/P 22FR (CATHETERS) IMPLANT
CATH THORACIC 28FR (CATHETERS) ×4 IMPLANT
CATH THORACIC 28FR RT ANG (CATHETERS) IMPLANT
CATH THORACIC 36FR (CATHETERS) IMPLANT
CATH THORACIC 36FR RT ANG (CATHETERS) IMPLANT
CLIP APPLIE ROT 10 11.4 M/L (STAPLE) IMPLANT
CLIP LIGATING EXTRA MED SLVR (CLIP) ×8 IMPLANT
CLIP TI WIDE RED SMALL 6 (CLIP) ×4 IMPLANT
CLIP VESOCCLUDE MED 6/CT (CLIP) ×4 IMPLANT
CONN ST 1/4X3/8  BEN (MISCELLANEOUS) ×6
CONN ST 1/4X3/8 BEN (MISCELLANEOUS) ×6 IMPLANT
CONN Y 3/8X3/8X3/8  BEN (MISCELLANEOUS)
CONN Y 3/8X3/8X3/8 BEN (MISCELLANEOUS) IMPLANT
CONT SPEC 4OZ CLIKSEAL STRL BL (MISCELLANEOUS) ×68 IMPLANT
COVER SURGICAL LIGHT HANDLE (MISCELLANEOUS) ×8 IMPLANT
DECANTER SPIKE VIAL GLASS SM (MISCELLANEOUS) ×8 IMPLANT
DERMABOND ADVANCED (GAUZE/BANDAGES/DRESSINGS) ×2
DERMABOND ADVANCED .7 DNX12 (GAUZE/BANDAGES/DRESSINGS) ×2 IMPLANT
DRAIN CHANNEL 28F RND 3/8 FF (WOUND CARE) ×4 IMPLANT
DRAPE CHEST BREAST 15X10 FENES (DRAPES) ×4 IMPLANT
DRAPE LAPAROSCOPIC ABDOMINAL (DRAPES) ×4 IMPLANT
DRAPE WARM FLUID 44X44 (DRAPE) ×4 IMPLANT
ELECT BLADE 6.5 EXT (BLADE) ×4 IMPLANT
ELECT REM PT RETURN 9FT ADLT (ELECTROSURGICAL) ×4
ELECTRODE REM PT RTRN 9FT ADLT (ELECTROSURGICAL) ×2 IMPLANT
GAUZE SPONGE 4X4 12PLY STRL (GAUZE/BANDAGES/DRESSINGS) ×4 IMPLANT
GAUZE SPONGE 4X4 12PLY STRL LF (GAUZE/BANDAGES/DRESSINGS) ×4 IMPLANT
GLOVE BIOGEL PI IND STRL 6.5 (GLOVE) ×2 IMPLANT
GLOVE BIOGEL PI INDICATOR 6.5 (GLOVE) ×2
GLOVE ECLIPSE 6.5 STRL STRAW (GLOVE) ×4 IMPLANT
GLOVE SURG SIGNA 7.5 PF LTX (GLOVE) ×12 IMPLANT
GLOVE SURG SS PI 6.0 STRL IVOR (GLOVE) ×4 IMPLANT
GOWN STRL REUS W/ TWL LRG LVL3 (GOWN DISPOSABLE) ×4 IMPLANT
GOWN STRL REUS W/ TWL XL LVL3 (GOWN DISPOSABLE) ×2 IMPLANT
GOWN STRL REUS W/TWL LRG LVL3 (GOWN DISPOSABLE) ×8 IMPLANT
GOWN STRL REUS W/TWL XL LVL3 (GOWN DISPOSABLE) ×2
HEMOSTAT SURGICEL 2X14 (HEMOSTASIS) IMPLANT
INSERT FOGARTY 61MM (MISCELLANEOUS) IMPLANT
KIT BASIN OR (CUSTOM PROCEDURE TRAY) ×4 IMPLANT
KIT ROOM TURNOVER OR (KITS) ×4 IMPLANT
KIT SUCTION CATH 14FR (SUCTIONS) ×4 IMPLANT
NS IRRIG 1000ML POUR BTL (IV SOLUTION) ×12 IMPLANT
PACK CHEST (CUSTOM PROCEDURE TRAY) ×4 IMPLANT
PAD ARMBOARD 7.5X6 YLW CONV (MISCELLANEOUS) ×8 IMPLANT
POUCH ENDO CATCH II 15MM (MISCELLANEOUS) IMPLANT
POUCH SPECIMEN RETRIEVAL 10MM (ENDOMECHANICALS) IMPLANT
RELOAD STAPLER GOLD 60MM (STAPLE) ×24 IMPLANT
RELOAD STAPLER GREEN 60MM (STAPLE) ×2 IMPLANT
SCISSORS ENDO CVD 5DCS (MISCELLANEOUS) IMPLANT
SEALANT PROGEL (MISCELLANEOUS) IMPLANT
SEALANT SURG COSEAL 4ML (VASCULAR PRODUCTS) IMPLANT
SEALANT SURG COSEAL 8ML (VASCULAR PRODUCTS) IMPLANT
SOLUTION ANTI FOG 6CC (MISCELLANEOUS) ×4 IMPLANT
SPECIMEN JAR LG PLASTIC EMPTY (MISCELLANEOUS) IMPLANT
SPECIMEN JAR MEDIUM (MISCELLANEOUS) ×4 IMPLANT
SPONGE INTESTINAL PEANUT (DISPOSABLE) ×12 IMPLANT
SPONGE TONSIL 1 RF SGL (DISPOSABLE) ×4 IMPLANT
STAPLE ECHEON FLEX 60 POW ENDO (STAPLE) ×4 IMPLANT
STAPLE RELOAD 2.5MM WHITE (STAPLE) ×24 IMPLANT
STAPLER RELOAD GOLD 60MM (STAPLE) ×48
STAPLER RELOAD GREEN 60MM (STAPLE) ×4
STAPLER VASCULAR ECHELON 35 (CUTTER) ×4 IMPLANT
STAPLER VISISTAT 35W (STAPLE) IMPLANT
SUT PROLENE 0 CT 1 CR/8 (SUTURE) IMPLANT
SUT PROLENE 1 CT (SUTURE) IMPLANT
SUT PROLENE 1 MO 6 (SUTURE) IMPLANT
SUT PROLENE 1 XLH 60 (SUTURE) IMPLANT
SUT PROLENE 2 0 CR (SUTURE) IMPLANT
SUT PROLENE 2 TP 1 (SUTURE) IMPLANT
SUT PROLENE 3 0 SH DA (SUTURE) IMPLANT
SUT PROLENE 4 0 RB 1 (SUTURE) ×4
SUT PROLENE 4 0 SH DA (SUTURE) IMPLANT
SUT PROLENE 4-0 RB1 .5 CRCL 36 (SUTURE) ×4 IMPLANT
SUT SILK  1 MH (SUTURE) ×4
SUT SILK 0 FSL (SUTURE) ×8 IMPLANT
SUT SILK 1 MH (SUTURE) ×4 IMPLANT
SUT SILK 1 TIES 10X30 (SUTURE) IMPLANT
SUT SILK 2 0 SH (SUTURE) IMPLANT
SUT SILK 2 0SH CR/8 30 (SUTURE) ×8 IMPLANT
SUT SILK 3 0 SH 30 (SUTURE) IMPLANT
SUT SILK 3 0SH CR/8 30 (SUTURE) IMPLANT
SUT VIC AB 1 CTX 18 (SUTURE) ×4 IMPLANT
SUT VIC AB 1 CTX 36 (SUTURE) ×6
SUT VIC AB 1 CTX36XBRD ANBCTR (SUTURE) ×6 IMPLANT
SUT VIC AB 2-0 CT1 27 (SUTURE)
SUT VIC AB 2-0 CT1 TAPERPNT 27 (SUTURE) IMPLANT
SUT VIC AB 2-0 CTX 36 (SUTURE) ×8 IMPLANT
SUT VIC AB 3-0 MH 27 (SUTURE) IMPLANT
SUT VIC AB 3-0 SH 18 (SUTURE) IMPLANT
SUT VIC AB 3-0 SH 27 (SUTURE)
SUT VIC AB 3-0 SH 27X BRD (SUTURE) IMPLANT
SUT VIC AB 3-0 X1 27 (SUTURE) ×12 IMPLANT
SUT VICRYL 0 UR6 27IN ABS (SUTURE) ×8 IMPLANT
SUT VICRYL 2 TP 1 (SUTURE) ×4 IMPLANT
SWAB COLLECTION DEVICE MRSA (MISCELLANEOUS) IMPLANT
SWAB CULTURE ESWAB REG 1ML (MISCELLANEOUS) IMPLANT
SYR 10ML LL (SYRINGE) ×4 IMPLANT
SYR CONTROL 10ML LL (SYRINGE) ×4 IMPLANT
SYSTEM SAHARA CHEST DRAIN ATS (WOUND CARE) ×4 IMPLANT
SYSTEM SAHARA CHEST DRAIN RE-I (WOUND CARE) ×4 IMPLANT
TAPE CLOTH 4X10 WHT NS (GAUZE/BANDAGES/DRESSINGS) ×4 IMPLANT
TAPE CLOTH SURG 4X10 WHT LF (GAUZE/BANDAGES/DRESSINGS) ×4 IMPLANT
TAPE UMBILICAL COTTON 1/8X30 (MISCELLANEOUS) IMPLANT
TOWEL GREEN STERILE (TOWEL DISPOSABLE) ×4 IMPLANT
TOWEL GREEN STERILE FF (TOWEL DISPOSABLE) ×4 IMPLANT
TRAP SPECIMEN MUCOUS 40CC (MISCELLANEOUS) IMPLANT
TRAY FOLEY METER SIL LF 16FR (CATHETERS) ×4 IMPLANT
TRAY FOLEY W/METER SILVER 16FR (SET/KITS/TRAYS/PACK) ×4 IMPLANT
TROCAR BLADELESS 5M (ENDOMECHANICALS) ×4 IMPLANT
TROCAR BLADELESS 5MM (ENDOMECHANICALS) ×4 IMPLANT
TUNNELER SHEATH ON-Q 11GX8 DSP (PAIN MANAGEMENT) ×4 IMPLANT
WATER STERILE IRR 1000ML POUR (IV SOLUTION) ×8 IMPLANT

## 2017-06-24 NOTE — Transfer of Care (Signed)
Immediate Anesthesia Transfer of Care Note  Patient: Krista Grimes  Procedure(s) Performed: THORACOTOMY MAJOR (Right Chest) RIGHT UPPER LOBECTOMY (Right Chest)  Patient Location: PACU  Anesthesia Type:General  Level of Consciousness: awake, drowsy and patient cooperative  Airway & Oxygen Therapy: Patient Spontanous Breathing and Patient connected to nasal cannula oxygen  Post-op Assessment: Report given to RN, Post -op Vital signs reviewed and stable and Patient moving all extremities  Post vital signs: Reviewed and stable  Last Vitals:  Vitals:   06/24/17 0625 06/24/17 1402  BP: (!) 159/74 92/64  Pulse:  60  Resp:  (!) 7  Temp:  36.4 C  SpO2:  96%    Last Pain:  Vitals:   06/24/17 1404  TempSrc:   PainSc: Asleep         Complications: No apparent anesthesia complications

## 2017-06-24 NOTE — Interval H&P Note (Signed)
History and Physical Interval Note:  06/24/2017 7:53 AM  Krista Grimes  has presented today for surgery, with the diagnosis of RUL SQUAMOUS CELL CARCINOMA  The various methods of treatment have been discussed with the patient and family. After consideration of risks, benefits and other options for treatment, the patient has consented to  Procedure(s): THORACOTOMY MAJOR (Right) RIGHT UPPER LOBECTOMY (Right) possible CHEST WALL RESECTION (Right) as a surgical intervention .  The patient's history has been reviewed, patient examined, no change in status, stable for surgery.  I have reviewed the patient's chart and labs.  Questions were answered to the patient's satisfaction.     [name]

## 2017-06-24 NOTE — Progress Notes (Signed)
Care of pt assumed by MA Delwyn Scoggin RN 

## 2017-06-24 NOTE — Anesthesia Procedure Notes (Signed)
Arterial Line Insertion Start/End2/11/2017 7:00 AM, 06/24/2017 7:20 AM Performed by: CRNA  Patient location: Pre-op. Preanesthetic checklist: patient identified, IV checked, site marked, risks and benefits discussed, surgical consent, monitors and equipment checked, pre-op evaluation and timeout performed Lidocaine 1% used for infiltration Left, radial was placed Catheter size: 20 G Hand hygiene performed , maximum sterile barriers used  and Seldinger technique used Allen's test indicative of satisfactory collateral circulation Attempts: 2 Procedure performed without using ultrasound guided technique. Following insertion, dressing applied and Biopatch. Post procedure assessment: normal  Additional procedure comments: Santiago Bur.

## 2017-06-24 NOTE — Anesthesia Preprocedure Evaluation (Signed)
Anesthesia Evaluation  Patient identified by MRN, date of birth, ID band Patient awake    Reviewed: Allergy & Precautions, NPO status , Patient's Chart, lab work & pertinent test results  Airway Mallampati: II  TM Distance: >3 FB Neck ROM: Full    Dental no notable dental hx.    Pulmonary neg pulmonary ROS, COPD, former smoker,    Pulmonary exam normal breath sounds clear to auscultation       Cardiovascular hypertension, Pt. on medications negative cardio ROS Normal cardiovascular exam Rhythm:Regular Rate:Normal     Neuro/Psych Anxiety Depression CVA, Residual Symptoms negative neurological ROS  negative psych ROS   GI/Hepatic negative GI ROS, Neg liver ROS,   Endo/Other  negative endocrine ROS  Renal/GU negative Renal ROS  negative genitourinary   Musculoskeletal negative musculoskeletal ROS (+) Arthritis , Osteoarthritis,    Abdominal   Peds negative pediatric ROS (+)  Hematology negative hematology ROS (+)   Anesthesia Other Findings   Reproductive/Obstetrics negative OB ROS                             Anesthesia Physical Anesthesia Plan  ASA: III  Anesthesia Plan: General   Post-op Pain Management:    Induction: Intravenous  PONV Risk Score and Plan: 3 and Ondansetron, Dexamethasone and Midazolam  Airway Management Planned: Double Lumen EBT  Additional Equipment: Arterial line  Intra-op Plan:   Post-operative Plan: Extubation in OR  Informed Consent: I have reviewed the patients History and Physical, chart, labs and discussed the procedure including the risks, benefits and alternatives for the proposed anesthesia with the patient or authorized representative who has indicated his/her understanding and acceptance.   Dental advisory given  Plan Discussed with: CRNA  Anesthesia Plan Comments:         Anesthesia Quick Evaluation

## 2017-06-24 NOTE — Anesthesia Procedure Notes (Addendum)
Procedure Name: Intubation Date/Time: 06/24/2017 8:11 AM Performed by: Lynda Rainwater, MD Pre-anesthesia Checklist: Patient identified, Emergency Drugs available, Suction available and Patient being monitored Patient Re-evaluated:Patient Re-evaluated prior to induction Oxygen Delivery Method: Circle System Utilized Preoxygenation: Pre-oxygenation with 100% oxygen Induction Type: IV induction Ventilation: Mask ventilation without difficulty Laryngoscope Size: Mac and 3 Grade View: Grade I Tube type: Oral Endobronchial tube: Double lumen EBT, EBT position confirmed by auscultation and EBT position confirmed by fiberoptic bronchoscope and 39 Fr Number of attempts: 1 Airway Equipment and Method: Stylet and Oral airway Placement Confirmation: ETT inserted through vocal cords under direct vision,  positive ETCO2 and breath sounds checked- equal and bilateral Tube secured with: Tape Dental Injury: Teeth and Oropharynx as per pre-operative assessment  Difficulty Due To: Difficulty was unanticipated Comments: Easy atraumatic induction and Intubation by Santiago Bur.  Henderson Cloud, CRNA

## 2017-06-24 NOTE — Care Management Note (Addendum)
Case Management Note  Patient Details  Name: SAHIAN KERNEY MRN: [id number] Date of Birth: 10/21/1945  Subjective/Objective:   From home with spouse, post op R Thoracotomy , lobectomy , chest wall resection.    2/11 [place], BSN - POD 4 Thoracotomy , RU Lobectomy ,conts with anterior chest tube to water seal. Patient states she will be going home with daughter, Shellee Milo to assist her at discharge.  She has a walker at home that belonged to her mother.  She states she has a PCP and medication coverage.                   Action/Plan: NCM will follow progression for transition of care.  Expected Discharge Date:                  Expected Discharge Plan:     In-House Referral:     Discharge planning Services  CM Consult  Post Acute Care Choice:    Choice offered to:     DME Arranged:    DME Agency:     HH Arranged:    HH Agency:     Status of Service:  In process, will continue to follow  If discussed at Long Length of Stay Meetings, dates discussed:    Additional Comments:  Zenon Mayo, RN 06/24/2017, 3:21 PM

## 2017-06-24 NOTE — Brief Op Note (Addendum)
06/24/2017  2:01 PM  PATIENT:  Krista Grimes  72 y.o. female  PRE-OPERATIVE DIAGNOSIS:  RUL SQUAMOUS CELL CARCINOMA  POST-OPERATIVE DIAGNOSIS:  RUL Squamous Cell Carcinoma.  PROCEDURE:   RIGHT THORACOTOMY RIGHT UPPER LOBECTOMY WITH EN BLOC RESECTION OF CHEST WALL SOFT TISSUE AT RIGHT APEX MEDIASTINAL LYMPH NODE DISSECTION ON-Q LOCAL ANESTHETIC CATHETER PLACEMENT  SURGEON:  Surgeon(s) and Role:    * [name], MD - Primary  PHYSICIAN ASSISTANT:  Nicholes Rough, PA-C   ANESTHESIA:  GENERAL  EBL:  750 mL   BLOOD ADMINISTERED:none  DRAINS: ONE STRIAGHT CHEST TUBE AND ONE BLAKE DRAIN   LOCAL MEDICATIONS USED:  OTHER ON-Q PUMP  SPECIMEN:  Source of Specimen:  RIGHT UPPER LOBE  DISPOSITION OF SPECIMEN:  PATHOLOGY  COUNTS:  YES  DICTATION: .Dragon Dictation  PLAN OF CARE: Admit to inpatient   PATIENT DISPOSITION:  ICU - intubated and hemodynamically stable.   Delay start of Pharmacological VTE agent (>24hrs) due to surgical blood loss or risk of bleeding: no  FINDINGS:- Mass in right apex with broad based attachment to pleura. No invasion of ribs. Anterior, posterior and superior margins negative for tumor on frozen section.

## 2017-06-25 ENCOUNTER — Inpatient Hospital Stay (HOSPITAL_COMMUNITY): Payer: Medicare HMO

## 2017-06-25 ENCOUNTER — Encounter (HOSPITAL_COMMUNITY): Payer: Self-pay | Admitting: Thoracic Surgery (Cardiothoracic Vascular Surgery)

## 2017-06-25 LAB — BASIC METABOLIC PANEL
ANION GAP: 11 (ref 5–15)
BUN: 8 mg/dL (ref 6–20)
CHLORIDE: 104 mmol/L (ref 101–111)
CO2: 23 mmol/L (ref 22–32)
CREATININE: 0.58 mg/dL (ref 0.44–1.00)
Calcium: 8.7 mg/dL — ABNORMAL LOW (ref 8.9–10.3)
GFR calc non Af Amer: >60 mL/min (ref >60)
Glucose, Bld: 140 mg/dL — ABNORMAL HIGH (ref 65–99)
POTASSIUM: 3.6 mmol/L (ref 3.5–5.1)
Sodium: 138 mmol/L (ref 135–145)

## 2017-06-25 LAB — CBC
HEMATOCRIT: 33.8 % — AB (ref 36.0–46.0)
Hemoglobin: 10.7 g/dL — ABNORMAL LOW (ref 12.0–15.0)
MCH: 31.6 pg (ref 26.0–34.0)
MCHC: 31.7 g/dL (ref 30.0–36.0)
MCV: 99.7 fL (ref 78.0–100.0)
Platelets: 181 10*3/uL (ref 150–400)
RBC: 3.39 MIL/uL — AB (ref 3.87–5.11)
RDW: 14.1 % (ref 11.5–15.5)
WBC: 13.4 10*3/uL — AB (ref 4.0–10.5)

## 2017-06-25 LAB — BLOOD GAS, ARTERIAL
Acid-Base Excess: 1.6 mmol/L (ref 0.0–2.0)
Bicarbonate: 26.1 mmol/L (ref 20.0–28.0)
O2 Content: 3 L/min
O2 Saturation: 98.9 %
PH ART: 7.387 (ref 7.350–7.450)
Patient temperature: 98.6
pCO2 arterial: 44.4 mmHg (ref 32.0–48.0)
pO2, Arterial: 134 mmHg — ABNORMAL HIGH (ref 83.0–108.0)

## 2017-06-25 LAB — GLUCOSE, CAPILLARY
GLUCOSE-CAPILLARY: 136 mg/dL — AB (ref 65–99)
GLUCOSE-CAPILLARY: 140 mg/dL — AB (ref 65–99)
GLUCOSE-CAPILLARY: 142 mg/dL — AB (ref 65–99)
GLUCOSE-CAPILLARY: 169 mg/dL — AB (ref 65–99)
Glucose-Capillary: 127 mg/dL — ABNORMAL HIGH (ref 65–99)
Glucose-Capillary: 124 mg/dL — ABNORMAL HIGH (ref 65–99)

## 2017-06-25 MED ORDER — INSULIN ASPART 100 UNIT/ML ~~LOC~~ SOLN
0.0000 [IU] | Freq: Three times a day (TID) | SUBCUTANEOUS | Status: DC
  Administered 2017-06-25 (×3): 2 [IU] via SUBCUTANEOUS
  Administered 2017-06-26: 3 [IU] via SUBCUTANEOUS
  Administered 2017-06-26 – 2017-06-28 (×5): 2 [IU] via SUBCUTANEOUS
  Administered 2017-06-28: 3 [IU] via SUBCUTANEOUS
  Administered 2017-06-29: 2 [IU] via SUBCUTANEOUS
  Administered 2017-06-29: 3 [IU] via SUBCUTANEOUS

## 2017-06-25 MED ORDER — CLOPIDOGREL BISULFATE 75 MG PO TABS: 75.0000 mg | ORAL_TABLET | Freq: Every day | ORAL | Status: DC

## 2017-06-25 MED ORDER — POTASSIUM CHLORIDE 10 MEQ/50ML IV SOLN
10.0000 meq | INTRAVENOUS | Status: AC
  Administered 2017-06-25 (×4): 10 meq via INTRAVENOUS
  Filled 2017-06-25 (×3): qty 50

## 2017-06-25 MED ORDER — CLOPIDOGREL BISULFATE 75 MG PO TABS
75.0000 mg | ORAL_TABLET | Freq: Every day | ORAL | Status: DC
  Administered 2017-06-26 – 2017-06-29 (×4): 75 mg via ORAL
  Filled 2017-06-25 (×6): qty 1

## 2017-06-25 MED ORDER — ALBUTEROL SULFATE (2.5 MG/3ML) 0.083% IN NEBU
3.0000 mL | INHALATION_SOLUTION | Freq: Four times a day (QID) | RESPIRATORY_TRACT | Status: DC
  Administered 2017-06-25 – 2017-06-27 (×9): 3 mL via RESPIRATORY_TRACT
  Filled 2017-06-25 (×11): qty 3

## 2017-06-25 MED ORDER — LISINOPRIL 20 MG PO TABS
30.0000 mg | ORAL_TABLET | Freq: Every day | ORAL | Status: DC
  Administered 2017-06-25: 30 mg via ORAL
  Filled 2017-06-25 (×2): qty 1

## 2017-06-25 NOTE — Anesthesia Postprocedure Evaluation (Signed)
Anesthesia Post Note  Patient: Krista Grimes  Procedure(s) Performed: THORACOTOMY MAJOR (Right Chest) RIGHT UPPER LOBECTOMY (Right Chest)     Patient location during evaluation: PACU Anesthesia Type: General Level of consciousness: awake and alert Pain management: pain level controlled Vital Signs Assessment: post-procedure vital signs reviewed and stable Respiratory status: spontaneous breathing, nonlabored ventilation and respiratory function stable Cardiovascular status: blood pressure returned to baseline and stable Postop Assessment: no apparent nausea or vomiting Anesthetic complications: no    Last Vitals:  Vitals:   06/25/17 0335 06/25/17 0400  BP:  (!) 147/76  Pulse:  88  Resp:  13  Temp: 36.6 C   SpO2:  98%    Last Pain:  Vitals:   06/25/17 0515  TempSrc:   PainSc: Asleep                 Lynda Rainwater

## 2017-06-25 NOTE — Plan of Care (Signed)
Pt and RN discussed the purpose and importance of utilizing Incentive Spirometer.

## 2017-06-25 NOTE — Progress Notes (Signed)
Patient ID: Krista Grimes, female   DOB: [birth year], 72 y.o.   MRN: [id number] EVENING ROUNDS NOTE :     [address]       [place] [address]             [phone]                 1 Day Post-Op Procedure(s) (LRB): THORACOTOMY MAJOR (Right) RIGHT UPPER LOBECTOMY (Right)  Total Length of Stay:  LOS: 1 day  BP 116/60 (BP Location: Right Arm)   Pulse 67   Temp 97.6 F (36.4 C) (Oral)   Resp 12   Wt 187 lb (84.8 kg)   SpO2 100%   BMI 28.43 kg/m   .Intake/Output      02/08 0701 - 02/09 0700   P.O. 600   I.V. (mL/kg) 482.5 (5.7)   IV Piggyback 200   Total Intake(mL/kg) 1282.5 (15.1)   Urine (mL/kg/hr) 125 (0.1)   Blood    Chest Tube 160   Total Output 285   Net +997.5         . bupivacaine 0.5 % ON-Q pump SINGLE CATH 400 mL    . dextrose 5 % and 0.45% NaCl 50 mL/hr at 06/25/17 0839  . potassium chloride       Lab Results  Component Value Date   WBC 13.4 (H) 06/25/2017   HGB 10.7 (L) 06/25/2017   HCT 33.8 (L) 06/25/2017   PLT 181 06/25/2017   GLUCOSE 140 (H) 06/25/2017   ALT 15 06/22/2017   AST 21 06/22/2017   NA 138 06/25/2017   K 3.6 06/25/2017   CL 104 06/25/2017   CREATININE 0.58 06/25/2017   BUN 8 06/25/2017   CO2 23 06/25/2017   INR 1.01 06/22/2017   Small air leak Confused on pca will d/c    [name] MD  Beeper [phone] Office [phone] 06/25/2017 8:53 PM

## 2017-06-25 NOTE — Progress Notes (Signed)
1 Day Post-Op Procedure(s) (LRB): THORACOTOMY MAJOR (Right) RIGHT UPPER LOBECTOMY (Right) Subjective: Some incisional pain Reluctant to mobilize per RN  Objective: Vital signs in last 24 hours: Temp:  [97.6 F (36.4 C)-98.7 F (37.1 C)] 97.9 F (36.6 C) (02/08 0335) Pulse Rate:  [60-88] 80 (02/08 0730) Cardiac Rhythm: Normal sinus rhythm;Heart block (02/08 0730) Resp:  [7-22] 12 (02/08 0730) BP: (92-179)/(58-103) 123/68 (02/08 0730) SpO2:  [94 %-100 %] 99 % (02/08 0730) Arterial Line BP: (94-195)/(40-121) 147/68 (02/08 0730)  Hemodynamic parameters for last 24 hours:    Intake/Output from previous day: 02/07 0701 - 02/08 0700 In: 4260 [I.V.:3960; IV Piggyback:300] Out: 6861 [Urine:2555; Blood:750; Chest Tube:700] Intake/Output this shift: No intake/output data recorded.  General appearance: alert, cooperative and no distress Neurologic: intact Heart: regular rate and rhythm Lungs: rhonchi bilaterally Abdomen: normal findings: soft, non-tender small air leak  Lab Results: Recent Labs    06/22/17 1124 06/24/17 1251 06/25/17 0417  WBC 6.8  --  13.4*  HGB 12.9 9.2* 10.7*  HCT 39.9 27.0* 33.8*  PLT 220  --  181   BMET:  Recent Labs    06/22/17 1124 06/24/17 1251 06/25/17 0417  NA 142 145 138  K 4.0 3.0* 3.6  CL 108  --  104  CO2 19*  --  23  GLUCOSE 107*  --  140*  BUN 11  --  8  CREATININE 0.63  --  0.58  CALCIUM 9.5  --  8.7*    PT/INR:  Recent Labs    06/22/17 1124  LABPROT 13.2  INR 1.01   ABG    Component Value Date/Time   PHART 7.387 06/25/2017 0405   HCO3 26.1 06/25/2017 0405   TCO2 27 06/24/2017 1251   ACIDBASEDEF 0.6 06/22/2017 1125   O2SAT 98.9 06/25/2017 0405   CBG (last 3)  Recent Labs    06/24/17 1918 06/25/17 0000 06/25/17 0333  GLUCAP 191* 169* 136*    Assessment/Plan: S/P Procedure(s) (LRB): THORACOTOMY MAJOR (Right) RIGHT UPPER LOBECTOMY (Right) POD # 1 right upper lobectomy  CV- hypertensive- resume  lisinopril  RESP_ s/p RUL, rhonchi- will change albuterol to QID  IS   Minimal air leak- CT to water seal  RENAL- creatinine OK,   Hypokalemia improved- will give additional K this AM  ENDO- CBG elevated last night- decrease IVF, SSI AC/HS  SCD _ enoxaparin for DVT prophylaxis  Mobilize    LOS: 1 day    [name] 06/25/2017

## 2017-06-26 ENCOUNTER — Inpatient Hospital Stay (HOSPITAL_COMMUNITY): Payer: Medicare HMO

## 2017-06-26 LAB — CBC
HCT: 33.5 % — ABNORMAL LOW (ref 36.0–46.0)
HEMOGLOBIN: 10.1 g/dL — AB (ref 12.0–15.0)
MCH: 31.4 pg (ref 26.0–34.0)
MCHC: 30.1 g/dL (ref 30.0–36.0)
MCV: 104 fL — ABNORMAL HIGH (ref 78.0–100.0)
Platelets: 184 10*3/uL (ref 150–400)
RBC: 3.22 MIL/uL — ABNORMAL LOW (ref 3.87–5.11)
RDW: 14.3 % (ref 11.5–15.5)
WBC: 13.3 10*3/uL — ABNORMAL HIGH (ref 4.0–10.5)

## 2017-06-26 LAB — COMPREHENSIVE METABOLIC PANEL
ALBUMIN: 3.1 g/dL — AB (ref 3.5–5.0)
ALK PHOS: 57 U/L (ref 38–126)
ALT: 14 U/L (ref 14–54)
ANION GAP: 11 (ref 5–15)
AST: 28 U/L (ref 15–41)
BUN: 14 mg/dL (ref 6–20)
CALCIUM: 9.2 mg/dL (ref 8.9–10.3)
CO2: 26 mmol/L (ref 22–32)
Chloride: 102 mmol/L (ref 101–111)
Creatinine, Ser: 0.97 mg/dL (ref 0.44–1.00)
GFR calc non Af Amer: 58 mL/min — ABNORMAL LOW (ref >60)
GLUCOSE: 138 mg/dL — AB (ref 65–99)
POTASSIUM: 4.5 mmol/L (ref 3.5–5.1)
SODIUM: 139 mmol/L (ref 135–145)
Total Bilirubin: 1 mg/dL (ref 0.3–1.2)
Total Protein: 5.9 g/dL — ABNORMAL LOW (ref 6.5–8.1)

## 2017-06-26 LAB — GLUCOSE, CAPILLARY
GLUCOSE-CAPILLARY: 151 mg/dL — AB (ref 65–99)
GLUCOSE-CAPILLARY: 148 mg/dL — AB (ref 65–99)
Glucose-Capillary: 146 mg/dL — ABNORMAL HIGH (ref 65–99)
Glucose-Capillary: 135 mg/dL — ABNORMAL HIGH (ref 65–99)

## 2017-06-26 LAB — MAGNESIUM: Magnesium: 1.9 mg/dL (ref 1.7–2.4)

## 2017-06-26 LAB — TSH: TSH: 1 u[IU]/mL (ref 0.350–4.500)

## 2017-06-26 MED ORDER — LISINOPRIL 10 MG PO TABS
10.0000 mg | ORAL_TABLET | Freq: Every day | ORAL | Status: DC
  Administered 2017-06-27 – 2017-06-29 (×3): 10 mg via ORAL
  Filled 2017-06-26 (×4): qty 1

## 2017-06-26 MED ORDER — SODIUM CHLORIDE 0.9% FLUSH
3.0000 mL | Freq: Two times a day (BID) | INTRAVENOUS | Status: DC
  Administered 2017-06-26 – 2017-06-30 (×9): 3 mL via INTRAVENOUS

## 2017-06-26 MED ORDER — AMIODARONE LOAD VIA INFUSION
150.0000 mg | Freq: Once | INTRAVENOUS | Status: AC
  Administered 2017-06-26: 150 mg via INTRAVENOUS
  Filled 2017-06-26: qty 83.34

## 2017-06-26 MED ORDER — SODIUM CHLORIDE 0.9% FLUSH: 3.0000 mL | INTRAVENOUS | Status: DC | PRN

## 2017-06-26 MED ORDER — POTASSIUM CHLORIDE CRYS ER 20 MEQ PO TBCR
20.0000 meq | EXTENDED_RELEASE_TABLET | Freq: Every day | ORAL | Status: DC
  Administered 2017-06-26 – 2017-07-01 (×6): 20 meq via ORAL
  Filled 2017-06-26 (×5): qty 1

## 2017-06-26 MED ORDER — AMIODARONE HCL IN DEXTROSE 360-4.14 MG/200ML-% IV SOLN
INTRAVENOUS | Status: AC
  Administered 2017-06-26: 150 mg via INTRAVENOUS
  Filled 2017-06-26: qty 400

## 2017-06-26 MED ORDER — AMIODARONE HCL IN DEXTROSE 360-4.14 MG/200ML-% IV SOLN
60.0000 mg/h | INTRAVENOUS | Status: AC
  Administered 2017-06-26 (×2): 60 mg/h via INTRAVENOUS

## 2017-06-26 MED ORDER — AMIODARONE HCL IN DEXTROSE 360-4.14 MG/200ML-% IV SOLN
30.0000 mg/h | INTRAVENOUS | Status: DC
  Administered 2017-06-26 – 2017-06-27 (×3): 30 mg/h via INTRAVENOUS
  Filled 2017-06-26 (×3): qty 200

## 2017-06-26 NOTE — Plan of Care (Signed)
Pt gradually tolerating increases in activity, ambulating increased distances with each ambulation.  Pt demonstrating compliance with pulm. toilet exercises (I.S./F.V.). Oxygen sats remain > 92% on 2 L [place].   Pains minimal per Pt reporting requiring only occasional Ultram with scheduled Tylenol. OnQ remains in place until 06-27-17.  Pt experiencing significant urine retention without urgency, requiring I/O straight caths x 2 with output of 700 & 600 cc respectfully. MD made aware and ordered foley to be replaced if retention continues.

## 2017-06-26 NOTE — Op Note (Signed)
[name], [name] NO.:  [id number]  MEDICAL RECORD NO.:  [id number]  LOCATION:                                 FACILITY:  PHYSICIAN:  Revonda Standard. Roxan Hockey, M.D. 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  PROCEDURES: 1. Right thoracotomy. 2. Right upper lobectomy with en bloc resection of chest wall soft     tissue. 3. Mediastinal lymph node dissection. 4. On-Q local anesthetic catheter placement.  SURGEON:  Revonda Standard. Roxan Hockey, M.D.  ASSISTANT:  Nicholes Rough, PA.  ANESTHESIA:  General.  FINDINGS:  Mass palpable in apex of right lung with broad-based attachment to pleura.  No evidence of bony invasion.  Frozen section of margins from superior, anterior, and posterior to the tumor were all negative for tumor.  Level 8 and level 9 lymph nodes negative for tumor. Bronchial margin negative for tumor.  CLINICAL NOTE:  Krista Grimes is a 72 year old woman with a history of tobacco abuse and COPD.  She also has multiple other medical problems. She initially presented with right chest pain radiating into her right arm.  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  There was no bony destruction.  She was treated with neoadjuvant chemoradiation and had a good response to treatment. We discussed performing a right upper lobectomy with likely need for thoracotomy and chest wall resection.  The indications, risks, benefits, and alternatives were discussed in detail with the patient.  She understood the significant risk of chronic pain and neurologic damage. She accepted those risks and agreed to proceed.  OPERATIVE NOTE:  Ms.  Grimes was brought to the preoperative holding area on June 24, 2017.  Anesthesia placed a central venous line and an arterial blood pressure monitoring line.  She was taken to the operating room, anesthetized, and intubated with a double-lumen endotracheal tube.  Sequential compression devices were placed on the calves for DVT prophylaxis.  Intravenous antibiotics were administered. A Foley catheter was placed.  She was placed in a left lateral decubitus position, and the right chest was prepped and draped in the usual sterile fashion.  Single lung ventilation of the left lung was initiated and was tolerated well throughout the procedure.  An incision was made in the seventh interspace in the midaxillary line.  A 5 mm port was inserted.  The thoracoscope was advanced into the chest.  There was good isolation of the right lung.  There was no pleural effusion.  There were some adhesions which appeared thin and filmy anteriorly, and then superiorly at the apex, there were dense adhesions.  A thoracotomy was made in the fourth interspace.  The latissimus muscle was divided.  The serratus muscle was spared and retracted anteriorly.  The 5th rib was cut to improve exposure and a retractor was placed.  There were some thin adhesions anteriorly the right upper lobe to the chest wall.  These were taken  down with cautery.  The fissures were incomplete.  The inferior ligament was divided.  Small level 8 and level 9 lymph nodes were identified.  These were both sent for frozen section, as if they were positive there would be no need to proceed with resection. Both returned negative on frozen section.  The pleural reflection was divided at the hilum anteriorly and posteriorly.  Level 7 and level 10 nodes were identified and removed, as were all lymph nodes that were encountered during the dissection, and sent as separate specimens for permanent pathology.  The right middle lobe vein  was identified.  The minor fissure was completed with sequential firings of an Echelon 60 mm powered stapler using gold cartridges.  The initial 2 staple lines were placed.  The middle and upper lobe veins were identified.  The upper lobe branches were dissected out, encircled, and divided.  Nodes were removed from around these vessels and were sent for pathology. The vein was divided with the endoscopic vascular stapler.  The pulmonary artery then was visible, and dissection was carried along the pulmonary artery.  The middle lobe branches were identified, and the minor fissure then was able to be completed preserving the middle lobe branches.  The anterior and apical branches of the upper lobe pulmonary artery came off at their normal location.  There were 2 posterior branches, both of which came off more posteriorly on the main pulmonary artery than normal.  These branches were identified.  Surrounding nodes were dissected off, and the branches were divided with the endoscopic vascular stapler.  There was then a slow and tedious dissection of the tissue overlying the anterior and apical branches.  The azygos vein was closely adherent and was carefully dissected off.  Multiple enlarged lymph nodes were removed from this area, and they largely appeared, otherwise, benign.  These branches then were encircled and divided with the endoscopic vascular stapler.  The major fissure then was completed between the superior segment of the lower lobe and the upper lobe with sequential firings of the Echelon stapler.  The bronchus was now visible, and the only remaining attachment at the hilum.  The Echelon stapler with a green cartridge was placed across the bronchus and closed.  A test inflation showed good aeration of the lower and middle lobes.  The stapler was fired transecting the bronchus.  Attention then was turned to the apical chest wall, and hook cautery was used to score the parietal  pleura a centimeter away from any attachments tp the pleura that were abnormal appearing, and then, a relatively clean plane was found along 1 rib and the dissection was carried on from that rib. The periosteum was removed with the specimen, but the bones were left in place.  In the intercostal spaces, the nerves and musculature were removed along with the specimen.  There was a wide margin inferiorly from the operative perspective.  Anteriorly, posteriorly, and superiorly soft tissue was sent for pathologic examination and no tumor was seen. Ultimately, the tumor was removed with the en bloc soft tissue from the chest wall.  The specimen was removed and sent for frozen section of the bronchial margin which returned with no tumor seen.  The chest was copiously irrigated with warm saline.  A test inflation showed no leakage from the bronchial stump or the staple lines.  An On-Q local anesthetic catheter was placed.  An attempt was made to tunnel this into a subpleural location, but multiple tears of the pleura  occurred, and therefore, was placed extrapleurally outside of the ribs.  It was primed with 10 mL of 0.5% Marcaine and secured with a 3-0 silk suture.  The middle lobe was tacked to the lower lobe using the Echelon stapler. This was to prevent middle lobe torsion.  A 28-French Blake drain was placed through the original port incision and directed posteriorly to the apex.  A 28-French chest tube was placed through a separate incision anterior to that and also directed to the apex.  Both chest tubes were secured with 0 silk sutures.  The anterior and posterior aspects of the 5th rib were reapproximated with a #2 Vicryl suture.  Drill holes were placed in the rib for passage of the needle.  Figure-of-eight sutures then were used to reapproximate the 4th and 5th ribs.  The right lung was reinflated.  The serratus and latissimus layers were closed with running #1 Vicryl sutures.  The  subcutaneous tissue and skin were closed in standard fashion.  The chest tubes were placed to suction.  The patient was placed back in a supine position.  She was extubated in the operating room and taken to the postanesthetic care unit in good condition.     Revonda Standard Roxan Hockey, M.D.     SCH/MEDQ  D:  06/24/2017  T:  06/26/2017  Job:  [id number]

## 2017-06-26 NOTE — Progress Notes (Signed)
  Amiodarone Drug - Drug Interaction Consult Note  Recommendations: Monitor for signs of muscle pain or weakness.  Amiodarone is metabolized by the cytochrome P450 system and therefore has the potential to cause many drug interactions. Amiodarone has an average plasma half-life of 50 days (range 20 to 100 days).   There is potential for drug interactions to occur several weeks or months after stopping treatment and the onset of drug interactions may be slow after initiating amiodarone.   [x]  Statins: Increased risk of myopathy. Simvastatin- restrict dose to 20mg  daily. Other statins: counsel patients to report any muscle pain or weakness immediately.  []  Anticoagulants: Amiodarone can increase anticoagulant effect. Consider warfarin dose reduction. Patients should be monitored closely and the dose of anticoagulant altered accordingly, remembering that amiodarone levels take several weeks to stabilize.  []  Antiepileptics: Amiodarone can increase plasma concentration of phenytoin, the dose should be reduced. Note that small changes in phenytoin dose can result in large changes in levels. Monitor patient and counsel on signs of toxicity.  []  Beta blockers: increased risk of bradycardia, AV block and myocardial depression. Sotalol - avoid concomitant use.  []   Calcium channel blockers (diltiazem and verapamil): increased risk of bradycardia, AV block and myocardial depression.  []   Cyclosporine: Amiodarone increases levels of cyclosporine. Reduced dose of cyclosporine is recommended.  []  Digoxin dose should be halved when amiodarone is started.  []  Diuretics: increased risk of cardiotoxicity if hypokalemia occurs.  []  Oral hypoglycemic agents (glyburide, glipizide, glimepiride): increased risk of hypoglycemia. Patient's glucose levels should be monitored closely when initiating amiodarone therapy.   []  Drugs that prolong the QT interval:  Torsades de pointes risk may be increased with  concurrent use - avoid if possible.  Monitor QTc, also keep magnesium/potassium WNL if concurrent therapy can't be avoided. Marland Kitchen Antibiotics: e.g. fluoroquinolones, erythromycin. . Antiarrhythmics: e.g. quinidine, procainamide, disopyramide, sotalol. . Antipsychotics: e.g. phenothiazines, haloperidol.  . Lithium, tricyclic antidepressants, and methadone.  Thank you,  Wynona Neat, PharmD, BCPS  06/26/2017 6:54 AM

## 2017-06-26 NOTE — Progress Notes (Signed)
2 Days Post-Op Procedure(s) (LRB): THORACOTOMY MAJOR (Right) RIGHT UPPER LOBECTOMY (Right) Subjective: Back in sinus Chest tube drainage better, air leak with cough  Objective: Vital signs in last 24 hours: Temp:  [97.6 F (36.4 C)-98.9 F (37.2 C)] 98.9 F (37.2 C) (02/09 0823) Pulse Rate:  [59-165] 59 (02/09 1130) Cardiac Rhythm: Normal sinus rhythm;Other (Comment) (02/09 0800) Resp:  [8-23] 15 (02/09 1130) BP: (82-142)/(48-79) 104/65 (02/09 1130) SpO2:  [93 %-100 %] 98 % (02/09 1130)  Hemodynamic parameters for last 24 hours:  stable  Intake/Output from previous day: 02/08 0701 - 02/09 0700 In: 1282.5 [P.O.:600; I.V.:482.5; IV Piggyback:200] Out: 1023 [Urine:803; Chest Tube:220] Intake/Output this shift: Total I/O In: 939.9 [P.O.:120; I.V.:819.9] Out: 980 [Urine:700; Chest Tube:280]       Exam    General- alert and comfortable    Neck- no JVD, no cervical adenopathy palpable, no carotid bruit   Lungs- clear without rales, wheezes   Cor- regular rate and rhythm, no murmur , gallop   Abdomen- soft, non-tender   Extremities - warm, non-tender, minimal edema   Neuro- oriented, appropriate, no focal weakness     Lab Results: Recent Labs    06/25/17 0417 06/26/17 0224  WBC 13.4* 13.3*  HGB 10.7* 10.1*  HCT 33.8* 33.5*  PLT 181 184   BMET:  Recent Labs    06/25/17 0417 06/26/17 0224  NA 138 139  K 3.6 4.5  CL 104 102  CO2 23 26  GLUCOSE 140* 138*  BUN 8 14  CREATININE 0.58 0.97  CALCIUM 8.7* 9.2    PT/INR: No results for input(s): LABPROT, INR in the last 72 hours. ABG    Component Value Date/Time   PHART 7.387 06/25/2017 0405   HCO3 26.1 06/25/2017 0405   TCO2 27 06/24/2017 1251   ACIDBASEDEF 0.6 06/22/2017 1125   O2SAT 98.9 06/25/2017 0405   CBG (last 3)  Recent Labs    06/25/17 1558 06/25/17 2115 06/26/17 0821  GLUCAP 124* 140* 151*    Assessment/Plan: S/P Procedure(s) (LRB): THORACOTOMY MAJOR (Right) RIGHT UPPER LOBECTOMY  (Right) Mobilize chest tubes to water seal  Cont iv amio   LOS: 2 days    Tharon Aquas Trigt III 06/26/2017

## 2017-06-26 NOTE — Progress Notes (Signed)
Patient ID: Krista Grimes, female   DOB: [birth year], 72 y.o.   MRN: [id number] EVENING ROUNDS NOTE :     [address]       [place] [address]             [phone]                 2 Days Post-Op Procedure(s) (LRB): THORACOTOMY MAJOR (Right) RIGHT UPPER LOBECTOMY (Right)  Total Length of Stay:  LOS: 2 days  BP (!) 122/58   Pulse 72   Temp 98.1 F (36.7 C) (Oral)   Resp 16   Wt 187 lb (84.8 kg)   SpO2 98%   BMI 28.43 kg/m   .Intake/Output      02/09 0701 - 02/10 0700   P.O. 480   I.V. (mL/kg) 1040.6 (12.3)   IV Piggyback    Total Intake(mL/kg) 1520.6 (17.9)   Urine (mL/kg/hr) 1300 (1.2)   Chest Tube 370   Total Output 1670   Net -149.4         . amiodarone 30 mg/hr (06/26/17 1204)  . bupivacaine 0.5 % ON-Q pump SINGLE CATH 400 mL    . dextrose 5 % and 0.45% NaCl 50 mL/hr at 06/25/17 0839  . potassium chloride       Lab Results  Component Value Date   WBC 13.3 (H) 06/26/2017   HGB 10.1 (L) 06/26/2017   HCT 33.5 (L) 06/26/2017   PLT 184 06/26/2017   GLUCOSE 138 (H) 06/26/2017   ALT 14 06/26/2017   AST 28 06/26/2017   NA 139 06/26/2017   K 4.5 06/26/2017   CL 102 06/26/2017   CREATININE 0.97 06/26/2017   BUN 14 06/26/2017   CO2 26 06/26/2017   TSH 1.000 06/26/2017   INR 1.01 06/22/2017   Walked small loop Only ultram Has need I/o caht x 2, may have to leave foley in place  [name] MD  Beeper [phone] Office [phone] 06/26/2017 7:41 PM

## 2017-06-26 NOTE — Progress Notes (Signed)
Pt experiencing urinary retention without urgency. Bladder scans revealed 559 and 681 cc respectfully. Foley catheter replaced per MD verbal order by Lily Kocher RN.  After insertion, return of 600 cc clear, yellow urine. Pt tolerated procedure without difficulty.

## 2017-06-27 ENCOUNTER — Inpatient Hospital Stay (HOSPITAL_COMMUNITY): Payer: Medicare HMO

## 2017-06-27 LAB — GLUCOSE, CAPILLARY
GLUCOSE-CAPILLARY: 132 mg/dL — AB (ref 65–99)
GLUCOSE-CAPILLARY: 93 mg/dL (ref 65–99)
Glucose-Capillary: 142 mg/dL — ABNORMAL HIGH (ref 65–99)
Glucose-Capillary: 109 mg/dL — ABNORMAL HIGH (ref 65–99)

## 2017-06-27 LAB — CBC
HCT: 29.8 % — ABNORMAL LOW (ref 36.0–46.0)
Hemoglobin: 9.5 g/dL — ABNORMAL LOW (ref 12.0–15.0)
MCH: 32.1 pg (ref 26.0–34.0)
MCHC: 31.9 g/dL (ref 30.0–36.0)
MCV: 100.7 fL — ABNORMAL HIGH (ref 78.0–100.0)
Platelets: 159 10*3/uL (ref 150–400)
RBC: 2.96 MIL/uL — ABNORMAL LOW (ref 3.87–5.11)
RDW: 14.1 % (ref 11.5–15.5)
WBC: 10.2 10*3/uL (ref 4.0–10.5)

## 2017-06-27 LAB — TSH: TSH: 1.324 u[IU]/mL (ref 0.350–4.500)

## 2017-06-27 LAB — BASIC METABOLIC PANEL
Anion gap: 11 (ref 5–15)
BUN: 15 mg/dL (ref 6–20)
CO2: 24 mmol/L (ref 22–32)
Calcium: 8.8 mg/dL — ABNORMAL LOW (ref 8.9–10.3)
Chloride: 101 mmol/L (ref 101–111)
Creatinine, Ser: 0.73 mg/dL (ref 0.44–1.00)
GFR calc Af Amer: >60 mL/min (ref >60)
GFR calc non Af Amer: >60 mL/min (ref >60)
Glucose, Bld: 130 mg/dL — ABNORMAL HIGH (ref 65–99)
Potassium: 3.6 mmol/L (ref 3.5–5.1)
Sodium: 136 mmol/L (ref 135–145)

## 2017-06-27 LAB — MAGNESIUM: Magnesium: 1.9 mg/dL (ref 1.7–2.4)

## 2017-06-27 MED ORDER — AMIODARONE HCL 200 MG PO TABS: 200.0000 mg | ORAL_TABLET | Freq: Two times a day (BID) | ORAL | Status: DC

## 2017-06-27 MED ORDER — AMIODARONE HCL 200 MG PO TABS: 200.0000 mg | ORAL_TABLET | Freq: Every day | ORAL | Status: DC

## 2017-06-27 MED ORDER — METOPROLOL TARTRATE 12.5 MG HALF TABLET: 12.5000 mg | ORAL_TABLET | Freq: Two times a day (BID) | ORAL | Status: DC

## 2017-06-27 MED ORDER — POTASSIUM CHLORIDE CRYS ER 20 MEQ PO TBCR
20.0000 meq | EXTENDED_RELEASE_TABLET | ORAL | Status: DC | PRN
  Administered 2017-06-29: 20 meq via ORAL
  Filled 2017-06-27: qty 1

## 2017-06-27 MED ORDER — AMIODARONE IV BOLUS ONLY 150 MG/100ML
150.0000 mg | Freq: Once | INTRAVENOUS | Status: AC
  Administered 2017-06-27: 150 mg via INTRAVENOUS
  Filled 2017-06-27: qty 100

## 2017-06-27 MED ORDER — ALBUTEROL SULFATE (2.5 MG/3ML) 0.083% IN NEBU: 3.0000 mL | INHALATION_SOLUTION | RESPIRATORY_TRACT | Status: DC | PRN

## 2017-06-27 NOTE — Progress Notes (Addendum)
Patient ID: Krista Grimes, female   DOB: [birth year], 72 y.o.   MRN: [id number] [facility] DAILY ICU PROGRESS NOTE                   [address]            [place] [address]          [phone]   3 Days Post-Op Procedure(s) (LRB): THORACOTOMY MAJOR (Right) RIGHT UPPER LOBECTOMY (Right)  Total Length of Stay:  LOS: 3 days   Subjective: Foley replaced last night after in and out cath 3 times for urinary retention  Objective: Vital signs in last 24 hours: Temp:  [97.8 F (36.6 C)-98.5 F (36.9 C)] 98.2 F (36.8 C) (02/10 0821) Pulse Rate:  [59-165] 72 (02/10 0900) Cardiac Rhythm: Normal sinus rhythm;Sinus bradycardia (02/10 0800) Resp:  [12-20] 16 (02/10 0900) BP: (86-153)/(46-72) 111/63 (02/10 0900) SpO2:  [89 %-100 %] 99 % (02/10 0900)  Filed Weights   06/24/17 0558  Weight: 187 lb (84.8 kg)    Weight change:    Hemodynamic parameters for last 24 hours:    Intake/Output from previous day: 02/09 0701 - 02/10 0700 In: 1961 [P.O.:720; I.V.:1241] Out: 3360 [Urine:2870; Chest Tube:490]  Intake/Output this shift: Total I/O In: 16.7 [I.V.:16.7] Out: 60 [Urine:60]  Current Meds: Scheduled Meds: . acetaminophen  1,000 mg Oral Q6H  . albuterol  3 mL Inhalation QID  . atorvastatin  40 mg Oral q1800  . bisacodyl  10 mg Oral Daily  . clopidogrel  75 mg Oral Daily  . enoxaparin (LOVENOX) injection  40 mg Subcutaneous Daily  . escitalopram  20 mg Oral Daily  . insulin aspart  0-15 Units Subcutaneous TID WC  . lisinopril  10 mg Oral Daily  . mouth rinse  15 mL Mouth Rinse BID  . potassium chloride  20 mEq Oral Daily  . senna-docusate  1 tablet Oral QHS  . sodium chloride flush  3 mL Intravenous Q12H   Continuous Infusions: . amiodarone 30 mg/hr (06/27/17 0936)  . bupivacaine 0.5 % ON-Q pump SINGLE CATH 400 mL    . dextrose 5 % and 0.45% NaCl 50 mL/hr at 06/25/17 0839   PRN Meds:.hydrALAZINE, oxyCODONE, potassium chloride, sodium chloride flush,  traMADol  General appearance: alert and cooperative Neurologic: intact Heart: regular rate and rhythm, S1, S2 normal, no murmur, click, rub or gallop Lungs: diminished breath sounds bibasilar Abdomen: soft, non-tender; bowel sounds normal; no masses,  no organomegaly Extremities: extremities normal, atraumatic, no cyanosis or edema and Homans sign is negative, no sign of DVT Wound: No air leak from chest tube this morning  Lab Results: CBC: Recent Labs    06/26/17 0224 06/27/17 0235  WBC 13.3* 10.2  HGB 10.1* 9.5*  HCT 33.5* 29.8*  PLT 184 159   BMET:  Recent Labs    06/26/17 0224 06/27/17 0235  NA 139 136  K 4.5 3.6  CL 102 101  CO2 26 24  GLUCOSE 138* 130*  BUN 14 15  CREATININE 0.97 0.73  CALCIUM 9.2 8.8*    CMET: Lab Results  Component Value Date   WBC 10.2 06/27/2017   HGB 9.5 (L) 06/27/2017   HCT 29.8 (L) 06/27/2017   PLT 159 06/27/2017   GLUCOSE 130 (H) 06/27/2017   ALT 14 06/26/2017   AST 28 06/26/2017   NA 136 06/27/2017   K 3.6 06/27/2017   CL 101 06/27/2017   CREATININE 0.73 06/27/2017   BUN 15  06/27/2017   CO2 24 06/27/2017   TSH 1.000 06/26/2017   INR 1.01 06/22/2017      PT/INR: No results for input(s): LABPROT, INR in the last 72 hours. Radiology: Dg Chest Port 1 View  Result Date: 06/27/2017 CLINICAL DATA:  Status post right thoracotomy/lobectomy. EXAM: PORTABLE CHEST 1 VIEW COMPARISON:  06/26/2017 and prior exams FINDINGS: Right thoracotomy/lobectomy changes are again noted. Two right thoracostomy tubes are unchanged with tips overlying the apex. Haziness overlying the right apical pleural space is compatible with hydropneumothorax. The size of the apical pneumothorax remains unchanged. A left Port-A-Cath with tip overlying the superior cavoatrial junction is again noted. No other significant changes. IMPRESSION: Right pleural fluid now noted with unchanged size of right apical pneumothorax. No other significant change. Electronically  Signed   By: Margarette Canada M.D.   On: 06/27/2017 07:15   I have independently reviewed the above radiology studies  and reviewed the findings with the patient.   Assessment/Plan: S/P Procedure(s) (LRB): THORACOTOMY MAJOR (Right) RIGHT UPPER LOBECTOMY (Right) Mobilize-walked around the unit  Foley replaced last night after in and out cath 3 times for urinary retention Renal function stable Holding sinus rhythm, will convert IV amnio to p.o.  Krista Grimes 06/27/2017 10:07 AM

## 2017-06-27 NOTE — Progress Notes (Addendum)
Pt in Afib RVR 120-130's during ambulation, c/o of shortness of breath. She was taken back to bed, BP 126/79. Dr Servando Snare notified and 150mg  Amiodarone bolus ordered. Pt to stay on IV Amio tonight, transition to PO will be reassessed tomorrow.

## 2017-06-27 NOTE — Progress Notes (Signed)
Patient ID: Krista Grimes, female   DOB: 1946-01-09, 72 y.o.   MRN: [id number] EVENING ROUNDS NOTE :     [address]       [place] [address]             [phone]                 3 Days Post-Op Procedure(s) (LRB): THORACOTOMY MAJOR (Right) RIGHT UPPER LOBECTOMY (Right)  Total Length of Stay:  LOS: 3 days  BP (!) 133/53   Pulse 72   Temp 99.3 F (37.4 C) (Oral)   Resp 18   Wt 187 lb (84.8 kg)   SpO2 99%   BMI 28.43 kg/m   .Intake/Output      02/10 0701 - 02/11 0700   P.O. 240   I.V. (mL/kg) 338.2 (4)   Total Intake(mL/kg) 578.2 (6.8)   Urine (mL/kg/hr) 1310 (1.2)   Chest Tube 90   Total Output 1400   Net -821.9         . amiodarone 30 mg/hr (06/27/17 1600)  . bupivacaine 0.5 % ON-Q pump SINGLE CATH 400 mL    . dextrose 5 % and 0.45% NaCl 50 mL/hr at 06/25/17 0839     Lab Results  Component Value Date   WBC 10.2 06/27/2017   HGB 9.5 (L) 06/27/2017   HCT 29.8 (L) 06/27/2017   PLT 159 06/27/2017   GLUCOSE 130 (H) 06/27/2017   ALT 14 06/26/2017   AST 28 06/26/2017   NA 136 06/27/2017   K 3.6 06/27/2017   CL 101 06/27/2017   CREATININE 0.73 06/27/2017   BUN 15 06/27/2017   CO2 24 06/27/2017   TSH 1.324 06/27/2017   INR 1.01 06/22/2017   Patient awake, cooperative Had recurrent atrial fib with increased rate this afternoon.  We bolused with IV amnio has converted back to sinus  [name] MD  Beeper [phone] Office [phone] 06/27/2017 7:38 PM

## 2017-06-27 NOTE — Progress Notes (Signed)
This note also relates to the following rows which could not be included: BP - Cannot attach notes to unvalidated device data MAP (mmHg) - Cannot attach notes to unvalidated device data Pulse Rate - Cannot attach notes to unvalidated device data ECG Heart Rate - Cannot attach notes to unvalidated device data Resp - Cannot attach notes to unvalidated device data SpO2 - Cannot attach notes to unvalidated device data  While measuring Pt EKG intervals, QTc noted to be .60 sec while on Amiodarone drip. Dr Servando Snare notified and orders received to D/C Amiodarone drip and start beta blocker. Will continue to monitor.

## 2017-06-28 ENCOUNTER — Inpatient Hospital Stay (HOSPITAL_COMMUNITY): Payer: Medicare HMO

## 2017-06-28 ENCOUNTER — Other Ambulatory Visit: Payer: Self-pay

## 2017-06-28 LAB — GLUCOSE, CAPILLARY
GLUCOSE-CAPILLARY: 160 mg/dL — AB (ref 65–99)
GLUCOSE-CAPILLARY: 90 mg/dL (ref 65–99)
GLUCOSE-CAPILLARY: 140 mg/dL — AB (ref 65–99)
Glucose-Capillary: 94 mg/dL (ref 65–99)

## 2017-06-28 LAB — CBC
HCT: 30.6 % — ABNORMAL LOW (ref 36.0–46.0)
Hemoglobin: 9.6 g/dL — ABNORMAL LOW (ref 12.0–15.0)
MCH: 31.4 pg (ref 26.0–34.0)
MCHC: 31.4 g/dL (ref 30.0–36.0)
MCV: 100 fL (ref 78.0–100.0)
Platelets: 198 10*3/uL (ref 150–400)
RBC: 3.06 MIL/uL — ABNORMAL LOW (ref 3.87–5.11)
RDW: 14.2 % (ref 11.5–15.5)
WBC: 9.5 10*3/uL (ref 4.0–10.5)

## 2017-06-28 LAB — BASIC METABOLIC PANEL
Anion gap: 9 (ref 5–15)
BUN: 7 mg/dL (ref 6–20)
CO2: 26 mmol/L (ref 22–32)
Calcium: 8.5 mg/dL — ABNORMAL LOW (ref 8.9–10.3)
Chloride: 105 mmol/L (ref 101–111)
Creatinine, Ser: 0.56 mg/dL (ref 0.44–1.00)
GFR calc Af Amer: >60 mL/min (ref >60)
GFR calc non Af Amer: >60 mL/min (ref >60)
Glucose, Bld: 126 mg/dL — ABNORMAL HIGH (ref 65–99)
Potassium: 3.8 mmol/L (ref 3.5–5.1)
Sodium: 140 mmol/L (ref 135–145)

## 2017-06-28 LAB — MAGNESIUM: Magnesium: 1.8 mg/dL (ref 1.7–2.4)

## 2017-06-28 MED ORDER — GUAIFENESIN ER 600 MG PO TB12
1200.0000 mg | ORAL_TABLET | Freq: Two times a day (BID) | ORAL | Status: AC
  Administered 2017-06-28 – 2017-06-30 (×5): 1200 mg via ORAL
  Filled 2017-06-28 (×5): qty 2

## 2017-06-28 MED ORDER — LEVALBUTEROL HCL 0.63 MG/3ML IN NEBU
0.6300 mg | INHALATION_SOLUTION | Freq: Three times a day (TID) | RESPIRATORY_TRACT | Status: DC
  Administered 2017-06-28 – 2017-06-30 (×7): 0.63 mg via RESPIRATORY_TRACT
  Filled 2017-06-28 (×7): qty 3

## 2017-06-28 MED ORDER — METOPROLOL TARTRATE 12.5 MG HALF TABLET
12.5000 mg | ORAL_TABLET | Freq: Two times a day (BID) | ORAL | Status: DC
  Administered 2017-06-28 – 2017-06-29 (×4): 12.5 mg via ORAL
  Filled 2017-06-28 (×4): qty 1

## 2017-06-28 MED ORDER — VITAMIN B-12 1000 MCG PO TABS
1000.0000 ug | ORAL_TABLET | Freq: Every day | ORAL | Status: DC
  Administered 2017-06-29 – 2017-07-01 (×3): 1000 ug via ORAL
  Filled 2017-06-28 (×3): qty 1

## 2017-06-28 MED ORDER — VITAMIN D 1000 UNITS PO TABS
1000.0000 [IU] | ORAL_TABLET | Freq: Every day | ORAL | Status: DC
  Administered 2017-06-29 – 2017-07-01 (×3): 1000 [IU] via ORAL
  Filled 2017-06-28 (×3): qty 1

## 2017-06-28 MED ORDER — OXYCODONE HCL 5 MG PO TABS
5.0000 mg | ORAL_TABLET | ORAL | Status: DC | PRN
  Administered 2017-06-29 – 2017-06-30 (×4): 5 mg via ORAL
  Filled 2017-06-28 (×4): qty 1

## 2017-06-28 NOTE — Progress Notes (Signed)
4 Days Post-Op Procedure(s) (LRB): THORACOTOMY MAJOR (Right) RIGHT UPPER LOBECTOMY (Right) Subjective: No complaints this am  Objective: Vital signs in last 24 hours: Temp:  [97.8 F (36.6 C)-99.3 F (37.4 C)] 98.3 F (36.8 C) (02/11 0741) Pulse Rate:  [62-133] 69 (02/11 0700) Cardiac Rhythm: Normal sinus rhythm;Sinus bradycardia (02/10 2000) Resp:  [12-21] 16 (02/11 0700) BP: (93-158)/(53-91) 93/55 (02/11 0700) SpO2:  [93 %-100 %] 93 % (02/11 0700)  Hemodynamic parameters for last 24 hours:    Intake/Output from previous day: 02/10 0701 - 02/11 0700 In: 1211.6 [P.O.:840; I.V.:371.6] Out: 2606 [Urine:2465; Stool:1; Chest Tube:140] Intake/Output this shift: Total I/O In: -  Out: 135 [Urine:125; Chest Tube:10]  General appearance: alert, cooperative and no distress Neurologic: intact Heart: regular rate and rhythm Lungs: diminished breath sounds on right Abdomen: normal findings: soft, non-tender Wound: clean and dry questionable air leak- large tidal variation  Lab Results: Recent Labs    06/27/17 0235 06/28/17 0506  WBC 10.2 9.5  HGB 9.5* 9.6*  HCT 29.8* 30.6*  PLT 159 198   BMET:  Recent Labs    06/27/17 0235 06/28/17 0506  NA 136 140  K 3.6 3.8  CL 101 105  CO2 24 26  GLUCOSE 130* 126*  BUN 15 7  CREATININE 0.73 0.56  CALCIUM 8.8* 8.5*    PT/INR: No results for input(s): LABPROT, INR in the last 72 hours. ABG    Component Value Date/Time   PHART 7.387 06/25/2017 0405   HCO3 26.1 06/25/2017 0405   TCO2 27 06/24/2017 1251   ACIDBASEDEF 0.6 06/22/2017 1125   O2SAT 98.9 06/25/2017 0405   CBG (last 3)  Recent Labs    06/27/17 1201 06/27/17 1645 06/27/17 2114  GLUCAP 109* 132* 93    Assessment/Plan: S/P Procedure(s) (LRB): THORACOTOMY MAJOR (Right) RIGHT UPPER LOBECTOMY (Right) Plan for transfer to step-down: see transfer orders  In SR currently. Has had intermittent atrial fib- amiodarone stopped for prolonged QTc- will check 12 lead  ECG this AM. On beta blocker Has congestion but unable to clear mucous with cough- CXR shows some increased atelectasis on right- increase flutter valve use, add mucinex Minimal serous drainage- dc posterior tube Keep anterior CT on water seal today Increase ambulation   LOS: 4 days    Krista Grimes 06/28/2017

## 2017-06-28 NOTE — Plan of Care (Signed)
Pt demonstrating improved tolerance of increase in activity with only complication of periods of Afib after. Pt responded well to Amiodarone.  Pt hemodynamics stable on Amiodarone drip until 06-27-17 when QTc measure > .480 secs. MD notified and drip D/C'd changed to beta blocker.  Pt continues to improve oxygenation with independently doing pulm. toilet. Rt PCT x 2 with minimal output and small airleak. Increasing PO intake of food brought in by family, no BM sa of yet.

## 2017-06-28 NOTE — Progress Notes (Signed)
[facility] BRIEF SICU PROGRESS NOTE  4 Days Post-Op  S/P Procedure(s) (LRB): THORACOTOMY MAJOR (Right) RIGHT UPPER LOBECTOMY (Right)   Stable day  Plan: Awaiting bed for transfer out of ICU  Rexene Alberts, MD 06/28/2017 6:12 PM

## 2017-06-28 NOTE — Plan of Care (Signed)
Patient ambulated around unit 3x. No c/o pain. Very congested cough, medications added by MD. Pulling 375 on IS and doing flutter valve. Able to void post foley removal.

## 2017-06-29 ENCOUNTER — Inpatient Hospital Stay (HOSPITAL_COMMUNITY): Payer: Medicare HMO

## 2017-06-29 DIAGNOSIS — I480 Paroxysmal atrial fibrillation: Secondary | ICD-10-CM

## 2017-06-29 DIAGNOSIS — Z8673 Personal history of transient ischemic attack (TIA), and cerebral infarction without residual deficits: Secondary | ICD-10-CM

## 2017-06-29 DIAGNOSIS — I10 Essential (primary) hypertension: Secondary | ICD-10-CM

## 2017-06-29 LAB — ECHOCARDIOGRAM COMPLETE
Ao-asc: 38 cm
CHL CUP MV DEC (S): 320
EWDT: 320 ms
FS: 30 % (ref 28–44)
HEIGHTINCHES: 68 in
IV/PV OW: 1.05
LA ID, A-P, ES: 30 mm
LA diam end sys: 30 mm
LA vol: 38.4 mL
LADIAMINDEX: 1.47 cm/m2
LAVOLA4C: 37.2 mL
LAVOLIN: 18.9 mL/m2
LV PW d: 9.1 mm — AB (ref 0.6–1.1)
LVOT area: 3.8 cm2
LVOT diameter: 22 mm
MV pk A vel: 119 m/s
MV pk E vel: 67.9 m/s
MVAP: 2.34 cm2
P 1/2 time: 94 ms
RV TAPSE: 19.3 mm
WEIGHTICAEL: 2992 [oz_av]

## 2017-06-29 LAB — BASIC METABOLIC PANEL
Anion gap: 11 (ref 5–15)
BUN: 9 mg/dL (ref 6–20)
CHLORIDE: 104 mmol/L (ref 101–111)
CO2: 25 mmol/L (ref 22–32)
CREATININE: 0.54 mg/dL (ref 0.44–1.00)
Calcium: 8.7 mg/dL — ABNORMAL LOW (ref 8.9–10.3)
GFR calc Af Amer: >60 mL/min (ref >60)
GFR calc non Af Amer: >60 mL/min (ref >60)
GLUCOSE: 143 mg/dL — AB (ref 65–99)
Potassium: 3.6 mmol/L (ref 3.5–5.1)
SODIUM: 140 mmol/L (ref 135–145)

## 2017-06-29 LAB — GLUCOSE, CAPILLARY
Glucose-Capillary: 125 mg/dL — ABNORMAL HIGH (ref 65–99)
Glucose-Capillary: 110 mg/dL — ABNORMAL HIGH (ref 65–99)
Glucose-Capillary: 154 mg/dL — ABNORMAL HIGH (ref 65–99)
Glucose-Capillary: 112 mg/dL — ABNORMAL HIGH (ref 65–99)

## 2017-06-29 MED ORDER — DILTIAZEM LOAD VIA INFUSION
10.0000 mg | INTRAVENOUS | Status: AC
  Administered 2017-06-29: 10 mg via INTRAVENOUS
  Filled 2017-06-29: qty 10

## 2017-06-29 MED ORDER — DILTIAZEM HCL ER COATED BEADS 120 MG PO CP24
120.0000 mg | ORAL_CAPSULE | Freq: Every day | ORAL | Status: DC
  Administered 2017-06-29: 120 mg via ORAL
  Filled 2017-06-29 (×2): qty 1

## 2017-06-29 MED ORDER — METOPROLOL TARTRATE 5 MG/5ML IV SOLN
INTRAVENOUS | Status: AC
  Administered 2017-06-29: 5 mg via INTRAVENOUS
  Filled 2017-06-29: qty 5

## 2017-06-29 MED ORDER — DILTIAZEM HCL-DEXTROSE 100-5 MG/100ML-% IV SOLN (PREMIX)
5.0000 mg/h | INTRAVENOUS | Status: DC
  Administered 2017-06-29: 10 mg/h via INTRAVENOUS
  Filled 2017-06-29: qty 100

## 2017-06-29 MED ORDER — METOPROLOL TARTRATE 5 MG/5ML IV SOLN
5.0000 mg | Freq: Once | INTRAVENOUS | Status: AC
  Administered 2017-06-29: 5 mg via INTRAVENOUS

## 2017-06-29 MED ORDER — POTASSIUM CHLORIDE CRYS ER 20 MEQ PO TBCR
20.0000 meq | EXTENDED_RELEASE_TABLET | ORAL | Status: AC
  Administered 2017-06-29 (×2): 20 meq via ORAL
  Filled 2017-06-29 (×2): qty 1

## 2017-06-29 MED ORDER — METOPROLOL TARTRATE 25 MG PO TABS
25.0000 mg | ORAL_TABLET | Freq: Three times a day (TID) | ORAL | Status: DC
  Administered 2017-06-29 – 2017-07-01 (×5): 25 mg via ORAL
  Filled 2017-06-29 (×5): qty 1

## 2017-06-29 NOTE — Progress Notes (Signed)
Around 0510, patient went into afib RVR, rate 110s-150s, began when patient started coughing up phlegm. Pt asymptomatic at this time, A&O x4, BP stable 130s/80s (MAPs 80-90). Rate and rhythm confirmed with EKG, QT/QTc 322/460ms. Paged Dr. Roxy Manns and reviewed all of the above information; MD gave verbal order to start cardizem gtt at 10mg  with a 10mg  bolus. Will administer and continue to monitor closely.

## 2017-06-29 NOTE — Progress Notes (Signed)
  Echocardiogram 2D Echocardiogram has been performed.  Krista Grimes 06/29/2017, 2:55 PM

## 2017-06-29 NOTE — Progress Notes (Signed)
EVENING ROUNDS NOTE :     [address]       [place] [address]             [phone]                 5 Days Post-Op Procedure(s) (LRB): THORACOTOMY MAJOR (Right) RIGHT UPPER LOBECTOMY (Right)  Total Length of Stay:  LOS: 5 days  BP 127/70   Pulse (!) 122   Temp 98.3 F (36.8 C) (Oral)   Resp (!) 24   Ht 5\' 8"  (1.727 m)   Wt 187 lb (84.8 kg)   SpO2 98%   BMI 28.43 kg/m   .Intake/Output      02/12 0701 - 02/13 0700   P.O.    I.V. (mL/kg)    Total Intake(mL/kg)    Urine (mL/kg/hr) 300 (0.3)   Stool 0   Chest Tube    Total Output 300   Net -300       Stool Occurrence 1 x     . bupivacaine 0.5 % ON-Q pump SINGLE CATH 400 mL    . dextrose 5 % and 0.45% NaCl Stopped (06/28/17 0715)  . diltiazem (CARDIZEM) infusion Stopped (06/29/17 0725)     Lab Results  Component Value Date   WBC 9.5 06/28/2017   HGB 9.6 (L) 06/28/2017   HCT 30.6 (L) 06/28/2017   PLT 198 06/28/2017   GLUCOSE 143 (H) 06/29/2017   ALT 14 06/26/2017   AST 28 06/26/2017   NA 140 06/29/2017   K 3.6 06/29/2017   CL 104 06/29/2017   CREATININE 0.54 06/29/2017   BUN 9 06/29/2017   CO2 25 06/29/2017   TSH 1.324 06/27/2017   INR 1.01 06/22/2017   Back in afib , rate 110-120  [name] MD  Beeper [phone] Office [phone] 06/29/2017 7:43 PM Patient ID: Krista Grimes, female   DOB: 17-Mar-1946, 72 y.o.   MRN: [id number]

## 2017-06-29 NOTE — Consult Note (Signed)
Cardiology Consultation:   Patient ID: Krista Grimes; [id number]; [birth year]   Admit date: 06/24/2017 Date of Consult: 06/29/2017  Primary Care Provider: [name], NP Primary Cardiologist: Krista Klein, MD - new Primary Electrophysiologist:     Patient Profile:   Krista Grimes is a 72 y.o. female with a hx of HTN, HLD, COPD, stroke x 2 (2 years ago), lung cancer, and anxiety/depression who is being seen today for the evaluation of Afib following RUL at the request of Dr. Roxan Grimes.  History of Present Illness:   Krista Grimes underwent right upper lobectomy via right thoracotomy on 06/26/17. Krista Grimes has had episodes of Afib since surgery. Per CTS notes, Krista Grimes had Afib 06/26/17, started IV amiodarone. Planned to transition IV amio to PO dosing.  Afib RVR 06/27/17, converted to NSR with amiodarone IV bolus. Amio was eventually stopped due to prolonging QTc. Afib RVR on 06/28/17, started diltiazem drip and converted to NSR 06/28/17. Now on PO diltiazem. Pt again found to be in Afib RVR in the 150s 06/29/17. IV cardizem with bolus was restarted. Cardiology was asked to manage paroxysmal Afib.   On my interview, Krista Grimes states Krista Grimes has never had an arrhythmia before. Krista Grimes had a first episode of palpitations this morning - telemetry review shows Afib RVR this AM. Krista Grimes is now in NSR without chest pain, SOB, dizziness, and palpitations. Krista Grimes states Krista Grimes has had lower extremity swelling, which is worsening following surgery.     Past Medical History:  Diagnosis Date  . Anxiety   . Arthritis   . Asthma   . Cerebrovascular disease   . COPD (chronic obstructive pulmonary disease) ([place])   . Depression   . Hyperlipidemia   . Hypertension   . Skin cancer   . 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 Stroke [facility])    x2    memory,speech affected    Past Surgical History:  Procedure Laterality Date  . ABDOMINAL HYSTERECTOMY    . HAND SURGERY    . LOBECTOMY Right 06/24/2017   Procedure: RIGHT UPPER LOBECTOMY;  Surgeon: [name], MD;  Location: [place];  Service: Thoracic;  Laterality: Right;  . port a cath placement  02/2017  . THORACOTOMY Right 06/24/2017   Procedure: THORACOTOMY MAJOR;  Surgeon: [name], MD;  Location: [place];  Service: Thoracic;  Laterality: Right;  . TUBAL LIGATION       Home Medications:  Prior to Admission medications   Medication Sig Start Date End Date Taking? Authorizing Provider  albuterol (PROVENTIL HFA;VENTOLIN HFA) 108 (90 Base) MCG/ACT inhaler Inhale 1-2 puffs into the lungs every 6 (six) hours as needed for wheezing or shortness of breath.    Yes [provider]  atorvastatin (LIPITOR) 40 MG tablet Take 40 mg by mouth daily.    Yes [provider]  clopidogrel (PLAVIX) 75 MG tablet Take 75 mg by mouth daily.   Yes [provider]  escitalopram (LEXAPRO) 20 MG tablet Take 20 mg by mouth daily.   Yes [provider]  lisinopril (PRINIVIL,ZESTRIL) 30 MG tablet Take 30 mg by mouth daily.   Yes [provider]  Omega 3 1000 MG CAPS Take 1,000 mg by mouth daily.    Yes [provider]  UNKNOWN TO PATIENT Antibiotic taking for 10 days, pt unsure of name of antibiotic, "500mg "   Yes [provider]  vitamin B-12 (CYANOCOBALAMIN) 1000 MCG tablet Take 1,000 mcg by mouth daily.  Yes [provider]  cholecalciferol (VITAMIN D) 1000 units tablet Take 1,000 Units by mouth daily.    [provider]    Inpatient Medications: Scheduled Meds: . acetaminophen  1,000 mg Oral Q6H  . atorvastatin  40 mg Oral q1800  . bisacodyl  10 mg Oral Daily  . cholecalciferol  1,000 Units Oral Daily  . clopidogrel  75 mg Oral Daily  . diltiazem  120 mg Oral Daily  . enoxaparin (LOVENOX) injection  40 mg Subcutaneous Daily  . escitalopram  20 mg Oral Daily  . guaiFENesin  1,200 mg Oral BID  . insulin aspart  0-15 Units Subcutaneous TID WC  . levalbuterol   0.63 mg Nebulization TID  . lisinopril  10 mg Oral Daily  . metoprolol tartrate  12.5 mg Oral BID  . potassium chloride  20 mEq Oral Daily  . potassium chloride  20 mEq Oral Q4H  . senna-docusate  1 tablet Oral QHS  . sodium chloride flush  3 mL Intravenous Q12H  . vitamin B-12  1,000 mcg Oral Daily   Continuous Infusions: . bupivacaine 0.5 % ON-Q pump SINGLE CATH 400 mL    . dextrose 5 % and 0.45% NaCl Stopped (06/28/17 0715)  . diltiazem (CARDIZEM) infusion Stopped (06/29/17 0725)   PRN Meds: hydrALAZINE, oxyCODONE, potassium chloride, sodium chloride flush, traMADol  Allergies:    Allergies  Allergen Reactions  . Codeine Nausea And Vomiting    Social History:   Social History   Socioeconomic History  . Marital status: Married    Spouse name: Not on file  . Number of children: Not on file  . Years of education: Not on file  . Highest education level: Not on file  Social Needs  . Financial resource strain: Not on file  . Food insecurity - worry: Not on file  . Food insecurity - inability: Not on file  . Transportation needs - medical: Not on file  . Transportation needs - non-medical: Not on file  Occupational History  . Not on file  Tobacco Use  . Smoking status: Former Smoker    Packs/day: 2.00    Years: 50.00    Pack years: 100.00    Types: Cigarettes    Last attempt to quit: 12/30/2016    Years since quitting: 0.4  . Smokeless tobacco: Never Used  Substance and Sexual Activity  . Alcohol use: No    Frequency: Never  . Drug use: No  . Sexual activity: Not on file  Other Topics Concern  . Not on file  Social History Narrative  . Not on file    Family History:    Family History  Problem Relation Age of Onset  . Colon cancer Mother   . Lung cancer Father   . COPD Father   . Lung cancer Brother   . Skin cancer Brother      ROS:  Please see the history of present illness.   All other ROS reviewed and negative.     Physical Exam/Data:    Vitals:   06/29/17 0715 06/29/17 0754 06/29/17 0800 06/29/17 0822  BP: (!) 104/53  120/77 (!) 138/105  Pulse: 65  66   Resp: 19  16   Temp: 98.3 F (36.8 C)     TempSrc: Oral     SpO2: 97% 98% 100%   Weight:      Height:        Intake/Output Summary (Last 24 hours) at 06/29/2017 1105 Last data filed  at 06/29/2017 0900 Gross per 24 hour  Intake 261 ml  Output 1800 ml  Net -1539 ml   Filed Weights   06/24/17 0558  Weight: 187 lb (84.8 kg)   Body mass index is 28.43 kg/m.  General:  Well nourished, well developed, in no acute distress HEENT: normal Neck: no JVD Vascular: No carotid bruits  Cardiac:  normal S1, S2; RRR; no murmur Lungs:  clear to auscultation bilaterally, no wheezing, rhonchi or rales  Abd: soft, nontender, no hepatomegaly  Ext: LE edema L > R Musculoskeletal:  No deformities, BUE and BLE strength normal and equal Skin: warm and dry  Neuro:  CNs 2-12 intact, no focal abnormalities noted Psych:  Normal affect   EKG:  The EKG was personally reviewed and demonstrates:  Afib RVR Telemetry:  Telemetry was personally reviewed and demonstrates:  Hx of Afib RVR, now in NSR in the 80s  Relevant CV Studies:  none  Laboratory Data:  Chemistry Recent Labs  Lab 06/27/17 0235 06/28/17 0506 06/29/17 0209  NA 136 140 140  K 3.6 3.8 3.6  CL 101 105 104  CO2 24 26 25   GLUCOSE 130* 126* 143*  BUN 15 7 9   CREATININE 0.73 0.56 0.54  CALCIUM 8.8* 8.5* 8.7*  GFRNONAA >60 >60 >60  GFRAA >60 >60 >60  ANIONGAP 11 9 11     Recent Labs  Lab 06/22/17 1124 06/26/17 0224  PROT 7.1 5.9*  ALBUMIN 4.2 3.1*  AST 21 28  ALT 15 14  ALKPHOS 80 57  BILITOT 0.8 1.0   Hematology Recent Labs  Lab 06/26/17 0224 06/27/17 0235 06/28/17 0506  WBC 13.3* 10.2 9.5  RBC 3.22* 2.96* 3.06*  HGB 10.1* 9.5* 9.6*  HCT 33.5* 29.8* 30.6*  MCV 104.0* 100.7* 100.0  MCH 31.4 32.1 31.4  MCHC 30.1 31.9 31.4  RDW 14.3 14.1 14.2  PLT 184 159 198   Cardiac EnzymesNo results  for input(s): TROPONINI in the last 168 hours. No results for input(s): TROPIPOC in the last 168 hours.  BNPNo results for input(s): BNP, PROBNP in the last 168 hours.  DDimer No results for input(s): DDIMER in the last 168 hours.  Radiology/Studies:  Dg Chest Port 1 View  Result Date: 06/29/2017 CLINICAL DATA:  Status post right lobectomy with chest tube in place EXAM: PORTABLE CHEST 1 VIEW COMPARISON:  June 28, 2017. FINDINGS: There is postoperative change on the right with chest tube remaining on the right. There is a persistent moderate pneumothorax on the right without tension component. There is volume loss on the right with consolidation in the right base and right pleural effusion. The left lung is mildly hyperexpanded clear. Port-A-Cath tip is in the superior vena cava. Heart size and pulmonary vascularity are normal. No adenopathy is evident by radiography. There is bilateral carotid artery calcification. IMPRESSION: Postoperative change on the right with moderate pneumothorax. No tension component. Chest tube remains in place. There remains volume loss and consolidation in the right lower lung zone region with small right pleural effusion. Surgical clips are present overlying the right perihilar region. Left lung hyperexpanded but clear. Stable cardiac silhouette. Port-A-Cath tip in superior vena cava. There is calcification in each carotid artery. Electronically Signed   By: Lowella Grip III M.D.   On: 06/29/2017 08:13   Dg Chest Port 1 View  Result Date: 06/28/2017 CLINICAL DATA:  Right thoracotomy with partial lung resection 06/24/2017. EXAM: PORTABLE CHEST 1 VIEW COMPARISON:  06/27/2017 and 06/26/2017 radiographs. FINDINGS: Two right-sided  chest tubes and a left subclavian Port-A-Cath are unchanged in position. There is increased volume loss in the right hemithorax with increased right perihilar opacity. Underlying right-sided hydropneumothorax has not significantly changed in  volume. The left lung is clear. The heart size and mediastinal contours are stable. A small amount of soft tissue emphysema is present within the right lateral chest wall. IMPRESSION: Increased volume loss and perihilar opacity in the right hemithorax status post partial right lung resection, likely atelectasis. Underlying right-sided hydropneumothorax is not significantly changed. Electronically Signed   By: Richardean Sale M.D.   On: 06/28/2017 07:36   Dg Chest Port 1 View  Result Date: 06/27/2017 CLINICAL DATA:  Status post right thoracotomy/lobectomy. EXAM: PORTABLE CHEST 1 VIEW COMPARISON:  06/26/2017 and prior exams FINDINGS: Right thoracotomy/lobectomy changes are again noted. Two right thoracostomy tubes are unchanged with tips overlying the apex. Haziness overlying the right apical pleural space is compatible with hydropneumothorax. The size of the apical pneumothorax remains unchanged. A left Port-A-Cath with tip overlying the superior cavoatrial junction is again noted. No other significant changes. IMPRESSION: Right pleural fluid now noted with unchanged size of right apical pneumothorax. No other significant change. Electronically Signed   By: Margarette Canada M.D.   On: 06/27/2017 07:15   Dg Chest Port 1 View  Result Date: 06/26/2017 CLINICAL DATA:  Chest tube in place EXAM: PORTABLE CHEST 1 VIEW COMPARISON:  Yesterday FINDINGS: Patient positioning appears similar to previous.Right apical pneumothorax that is 3 rib interspaces tall compared to 2 yesterday. Right-sided chest tube in stable position. Postoperative volume loss on the right with hilar and pulmonary clips/sutures. Clear, hyperinflated left lung. Cardiomegaly. IMPRESSION: Mild increase in small right apical pneumothorax. Electronically Signed   By: Monte Fantasia M.D.   On: 06/26/2017 07:11    Assessment and Plan:   1. Paroxysmal Afib in the post-operative period following right thoracotomy and RUL - pt has had paroxysmal Afib since  surgery, initially controlled with amiodarone but stopped for prolonging QTc (06/26/17) - in review of EKG, QTc does not appear prolonged - will reserve this option if not able to rate-control with beta blocker and diltiazem - now on diltiazem running at 5 mg/hr in addition to 120 mg cardizem CD and lopressor 12.5 mg BID - transition IV diltiazem to higher dose of cardizem CD - will increase to 180 mg today - EKG with afib RVR in the 130s this  morning - telemetry with NSR in the 80s - This patients CHA2DS2-VASc Score and unadjusted Ischemic Stroke Rate (% per year) is equal to 9.7 % stroke rate/year from a score of 6 (HTN, DM, age, female, stroke) - recommend eliquis 5 mg BID to start when safe to do so per TCTS - will obtain echocardiogram, which may help guide medication selection    2. History of stroke - On plavix for history of stroke. Will D/C in favor of anticoagulation, when safe to do so from TCTS   3. HTN Pressures have been labile given her ongoing diltiazem and lopressor. Continue to monitor.   For questions or updates, please contact [place] Please consult [email or web address] for contact info under Cardiology/STEMI.   Signed, Ledora Bottcher, PA  06/29/2017 11:05 AM  I have seen and examined the patient along with Ledora Bottcher, PA.  I have reviewed the chart, notes and new data.  I agree with PA's note.  Key new complaints: Currently without any cardiovascular complaints.  Krista Grimes was minimally aware of palpitations even  when the ventricular rate was 140 bpm.  History of 2 strokes in the past without clear diagnosis of source. Key examination changes: Normal cardiovascular exam. Key new findings / data: EKG shows atrial fibrillation rapid ventricular response.  The QT interval was at most borderline prolonged even when Krista Grimes was on amiodarone.  QTc during atrial fibrillation rapid ventricular response was 477 ms, but hard to accurately measure with a regular rhythm and  background atrial fibrillation.  During sinus rhythm the QT interval is upper limit of normal.  PLAN: Strongly suspect that this is incidentally discovered atrial fibrillation during hospitalization, rather than triggered by her recent procedure.  Retrospectively, it is likely that this arrhythmia is the cause of her 2 previous strokes. Krista Grimes is minimally symptomatic with atrial fibrillation and will benefit from a rate control only strategy.  There is really no reason to restart amiodarone unless Krista Grimes develops hypotension with conventional AV nodal blocking agents.  Recommend stopping clopidogrel and placing on lifelong oral anticoagulant, preferably apixaban or rivaroxaban.  Anticoagulation should be started as soon as felt to be safe by surgery.   Check thyroid function tests.  Echocardiogram pending, doubt significant structural heart disease based on physical exam.   Krista Klein, MD, [facility] [phone] 06/29/2017, 1:28 PM

## 2017-06-29 NOTE — Progress Notes (Signed)
5 Days Post-Op Procedure(s) (LRB): THORACOTOMY MAJOR (Right) RIGHT UPPER LOBECTOMY (Right) Subjective: Feels better this AM Ambulated around unit yesterday  Objective: Vital signs in last 24 hours: Temp:  [98 F (36.7 C)-98.8 F (37.1 C)] 98.3 F (36.8 C) (02/12 0715) Pulse Rate:  [28-133] 65 (02/12 0715) Cardiac Rhythm: Normal sinus rhythm (02/12 0630) Resp:  [13-25] 19 (02/12 0715) BP: (90-143)/(39-105) 138/105 (02/12 0822) SpO2:  [90 %-100 %] 98 % (02/12 0754)  Hemodynamic parameters for last 24 hours:    Intake/Output from previous day: 02/11 0701 - 02/12 0700 In: 381 [P.O.:360; I.V.:21] Out: 1715 [Urine:1515; Chest Tube:200] Intake/Output this shift: No intake/output data recorded.  General appearance: alert, cooperative and no distress Neurologic: intact Heart: regular rate and rhythm Lungs: diminished breath sounds on right, No wheezing no air leak  Lab Results: Recent Labs    06/27/17 0235 06/28/17 0506  WBC 10.2 9.5  HGB 9.5* 9.6*  HCT 29.8* 30.6*  PLT 159 198   BMET:  Recent Labs    06/28/17 0506 06/29/17 0209  NA 140 140  K 3.8 3.6  CL 105 104  CO2 26 25  GLUCOSE 126* 143*  BUN 7 9  CREATININE 0.56 0.54  CALCIUM 8.5* 8.7*    PT/INR: No results for input(s): LABPROT, INR in the last 72 hours. ABG    Component Value Date/Time   PHART 7.387 06/25/2017 0405   HCO3 26.1 06/25/2017 0405   TCO2 27 06/24/2017 1251   ACIDBASEDEF 0.6 06/22/2017 1125   O2SAT 98.9 06/25/2017 0405   CBG (last 3)  Recent Labs    06/28/17 1712 06/28/17 2121 06/29/17 0738  GLUCAP 140* 94 125*    Assessment/Plan: S/P Procedure(s) (LRB): THORACOTOMY MAJOR (Right) RIGHT UPPER LOBECTOMY (Right) Plan for transfer to step-down: see transfer orders  Awaiting step down bed No air leak- dc chest tube Still has some atelectasis on CXR- continue IS, flutter Atrial fib- had another episode yesterday- converted back to SR with diltiazem, drip now off, will start PO  diltiazem QTc yesterday was 460 Supplement K Continue ambulation Path still pending   LOS: 5 days    [name] 06/29/2017

## 2017-06-30 ENCOUNTER — Inpatient Hospital Stay (HOSPITAL_COMMUNITY): Payer: Medicare HMO

## 2017-06-30 DIAGNOSIS — Z902 Acquired absence of lung [part of]: Secondary | ICD-10-CM

## 2017-06-30 DIAGNOSIS — J449 Chronic obstructive pulmonary disease, unspecified: Secondary | ICD-10-CM

## 2017-06-30 LAB — BASIC METABOLIC PANEL
ANION GAP: 10 (ref 5–15)
BUN: 11 mg/dL (ref 6–20)
CHLORIDE: 102 mmol/L (ref 101–111)
CO2: 25 mmol/L (ref 22–32)
Calcium: 8.6 mg/dL — ABNORMAL LOW (ref 8.9–10.3)
Creatinine, Ser: 0.62 mg/dL (ref 0.44–1.00)
GFR calc non Af Amer: >60 mL/min (ref >60)
Glucose, Bld: 128 mg/dL — ABNORMAL HIGH (ref 65–99)
Potassium: 3.7 mmol/L (ref 3.5–5.1)
Sodium: 137 mmol/L (ref 135–145)

## 2017-06-30 LAB — GLUCOSE, CAPILLARY
GLUCOSE-CAPILLARY: 128 mg/dL — AB (ref 65–99)
GLUCOSE-CAPILLARY: 138 mg/dL — AB (ref 65–99)
Glucose-Capillary: 125 mg/dL — ABNORMAL HIGH (ref 65–99)
Glucose-Capillary: 144 mg/dL — ABNORMAL HIGH (ref 65–99)

## 2017-06-30 MED ORDER — DILTIAZEM HCL ER COATED BEADS 240 MG PO CP24
240.0000 mg | ORAL_CAPSULE | Freq: Every day | ORAL | Status: DC
  Administered 2017-06-30 – 2017-07-01 (×2): 240 mg via ORAL
  Filled 2017-06-30 (×2): qty 1

## 2017-06-30 MED ORDER — APIXABAN 5 MG PO TABS
5.0000 mg | ORAL_TABLET | Freq: Two times a day (BID) | ORAL | Status: DC
  Administered 2017-06-30 – 2017-07-01 (×3): 5 mg via ORAL
  Filled 2017-06-30 (×3): qty 1

## 2017-06-30 MED ORDER — LEVALBUTEROL HCL 0.63 MG/3ML IN NEBU: 0.6300 mg | INHALATION_SOLUTION | Freq: Four times a day (QID) | RESPIRATORY_TRACT | Status: DC | PRN

## 2017-06-30 NOTE — Care Management Note (Addendum)
Case Management Note  Patient Details  Name: Krista Grimes MRN: [id number] Date of Birth: [birth year]  Subjective/Objective:   From home with spouse, post op R Thoracotomy , lobectomy , chest wall resection.    2/11 [name], BSN - POD 4 Thoracotomy , RU Lobectomy ,conts with anterior chest tube to water seal. Patient states she will be going home with daughter, Krista Grimes to assist her at discharge.  She has a walker at home that belonged to her mother.  She states she has a PCP and medication coverage.     2/13 [name], BSN- Eliquis is new medication for patient, NCM awaiting benefit check.  Preferred pharmacy is [facility] in [place] and they do have eliquis in [place].                                  Action/Plan: NCM will follow for transition of care.   Expected Discharge Date:                  Expected Discharge Plan:     In-House Referral:     Discharge planning Services  CM Consult  Post Acute Care Choice:    Choice offered to:     DME Arranged:    DME Agency:     HH Arranged:    HH Agency:     Status of Service:  In process, will continue to follow  If discussed at Long Length of Stay Meetings, dates discussed:    Additional Comments:  Zenon Mayo, RN 06/30/2017, 10:50 AM

## 2017-06-30 NOTE — Progress Notes (Signed)
6 Days Post-Op Procedure(s) (LRB): THORACOTOMY MAJOR (Right) RIGHT UPPER LOBECTOMY (Right) Subjective: No complaints this AM Denies pain Had some transient atrial fib last night  Objective: Vital signs in last 24 hours: Temp:  [97.8 F (36.6 C)-99.4 F (37.4 C)] 98.6 F (37 C) (02/13 0808) Pulse Rate:  [69-130] 71 (02/13 0800) Cardiac Rhythm: Normal sinus rhythm (02/13 0800) Resp:  [15-24] 21 (02/13 0800) BP: (105-137)/(45-91) 127/60 (02/13 0800) SpO2:  [86 %-98 %] 98 % (02/13 0810)  Hemodynamic parameters for last 24 hours:    Intake/Output from previous day: 02/12 0701 - 02/13 0700 In: 240 [P.O.:240] Out: 600 [Urine:600] Intake/Output this shift: No intake/output data recorded.  General appearance: alert, cooperative and no distress Neurologic: intact Heart: regular rate and rhythm Lungs: diminished breath sounds on right Wound: clean and druy  Lab Results: Recent Labs    06/28/17 0506  WBC 9.5  HGB 9.6*  HCT 30.6*  PLT 198   BMET:  Recent Labs    06/29/17 0209 06/30/17 0236  NA 140 137  K 3.6 3.7  CL 104 102  CO2 25 25  GLUCOSE 143* 128*  BUN 9 11  CREATININE 0.54 0.62  CALCIUM 8.7* 8.6*    PT/INR: No results for input(s): LABPROT, INR in the last 72 hours. ABG    Component Value Date/Time   PHART 7.387 06/25/2017 0405   HCO3 26.1 06/25/2017 0405   TCO2 27 06/24/2017 1251   ACIDBASEDEF 0.6 06/22/2017 1125   O2SAT 98.9 06/25/2017 0405   CBG (last 3)  Recent Labs    06/29/17 1551 06/29/17 2220 06/30/17 0806  GLUCAP 154* 112* 125*    Assessment/Plan: S/P Procedure(s) (LRB): THORACOTOMY MAJOR (Right) RIGHT UPPER LOBECTOMY (Right) transfer to telemetry  CV- continues to have intermittent atrial fib- on metoprolol and diltiazem  Appreciate Dr. Victorino December assistance  Will start Eliquis today  RESP- CXR shows an apical space. Overall good result  Path- no residual tumor- good prognostic sign  RENAL- creatinine and lytes OK  DC  enoxaparin as she will be fully anticoagulated  Continue ambulation   LOS: 6 days    Krista Grimes 06/30/2017

## 2017-06-30 NOTE — Discharge Instructions (Addendum)
Information on my medicine - ELIQUIS (apixaban)  This medication education was reviewed with me or my healthcare representative as part of my discharge preparation.    Why was Eliquis prescribed for you? Eliquis was prescribed for you to reduce the risk of a blood clot forming that can cause a stroke if you have a medical condition called atrial fibrillation (a type of irregular heartbeat).  What do You need to know about Eliquis ? Take your Eliquis TWICE DAILY - one tablet in the morning and one tablet in the evening with or without food. If you have difficulty swallowing the tablet whole please discuss with your pharmacist how to take the medication safely.  Take Eliquis exactly as prescribed by your doctor and DO NOT stop taking Eliquis without talking to the doctor who prescribed the medication.  Stopping may increase your risk of developing a stroke.  Refill your prescription before you run out.  After discharge, you should have regular check-up appointments with your healthcare provider that is prescribing your Eliquis.  In the future your dose may need to be changed if your kidney function or weight changes by a significant amount or as you get older.  What do you do if you miss a dose? If you miss a dose, take it as soon as you remember on the same day and resume taking twice daily.  Do not take more than one dose of ELIQUIS at the same time to make up a missed dose.  Important Safety Information A possible side effect of Eliquis is bleeding. You should call your healthcare provider right away if you experience any of the following: ? Bleeding from an injury or your nose that does not stop. ? Unusual colored urine (red or dark brown) or unusual colored stools (red or black). ? Unusual bruising for unknown reasons. ? A serious fall or if you hit your head (even if there is no bleeding).  Some medicines may interact with Eliquis and might increase your risk of bleeding or  clotting while on Eliquis. To help avoid this, consult your healthcare provider or pharmacist prior to using any new prescription or non-prescription medications, including herbals, vitamins, non-steroidal anti-inflammatory drugs (NSAIDs) and supplements.  This website has more information on Eliquis (apixaban): http://www.eliquis.com/eliquis/home   Thoracotomy, Care After This sheet gives you information about how to care for yourself after your procedure. Your doctor may also give you more specific instructions. If you have problems or questions, contact your doctor. Follow these instructions at home: Preventing lung infection ( pneumonia)  Take deep breaths or do breathing exercises as told by your doctor.  Cough often. Coughing is important to clear thick spit (phlegm) and open your lungs. If coughing hurts, hold a pillow against your chest or place both hands flat on top of your cut (splinting) when you cough. This may help with discomfort.  Use an incentive spirometer as told. This is a tool that measures how well you fill your lungs with each breath.  Do lung therapy (pulmonary rehabilitation) as told. Medicines  Take over-the-counter or prescription medicines only as told by your doctor.  If you have pain, take pain-relieving medicine before your pain gets very bad. This will help you breathe and cough more comfortably.  If you were prescribed an antibiotic medicine, take it as told by your doctor. Do not stop taking the antibiotic even if you start to feel better. Activity  Ask your doctor what activities are safe for you.  Do not  travel by airplane for 2 weeks after your chest tube is removed, or until your doctor says that this is safe.  Do not lift anything that is heavier than 10 lb (4.5 kg), or the limit that your doctor tells you, until he or she says that it is safe.  Do not drive until your doctor approves. ? Do not drive or use heavy machinery while taking  prescription pain medicine. Incision care  Follow instructions from your doctor about how to take care of your cut from surgery (incision). Make sure you: ? Wash your hands with soap and water before you change your bandage (dressing). If you cannot use soap and water, use hand sanitizer. ? Change your bandage as told by your doctor. ? Leave stitches (sutures), skin glue, or skin tape (adhesive) strips in place. They may need to stay in place for 2 weeks or longer. If tape strips get loose and curl up, you may trim the loose edges. Do not remove tape strips completely unless your doctor says it is okay.  Keep your bandage dry.  Check your cut from surgery every day for signs of infection. Check for: ? More redness, swelling, or pain. ? More fluid or blood. ? Warmth. ? Pus or a bad smell. Bathing  Do not take baths, swim, or use a hot tub until your doctor approves. You may take showers.  After your bandage has been removed, use soap and water to gently wash your cut from surgery. Do not use anything else to clean your cut unless your doctor tells you to. Eating and drinking  Eat a healthy diet as told by your doctor. A healthy diet includes: ? Fresh fruits and vegetables. ? Whole grains. ? Low-fat (lean) proteins.  Drink enough fluid to keep your pee (urine) clear or pale yellow. General instructions  To prevent or treat trouble pooping (constipation) while you are taking prescription pain medicine, your doctor may recommend that you: ? Take over-the-counter or prescription medicines. ? Eat foods that are high in fiber. These include fresh fruits and vegetables, whole grains, and beans. ? Limit foods that are high in fat and processed sugars, such as fried and sweet foods.  Do not use any products that contain nicotine or tobacco. These include cigarettes and e-cigarettes. If you need help quitting, ask your doctor.  Avoid secondhand smoke.  Wear compression stockings as told.  These help to prevent blood clots and reduce swelling in your legs.  If you have a chest tube, care for it as told.  Keep all follow-up visits as told by your doctor. This is important. Contact a doctor if:  You have more redness, swelling, or pain around your cut from surgery.  You have more fluid or blood coming from your cut from surgery.  Your cut from surgery feels warm to the touch.  You have pus or a bad smell coming from your cut from surgery.  You have a fever or chills.  Your heartbeat seems uneven.  You feel sick to your stomach (nauseous).  You throw up (vomit).  You have muscle aches.  You have trouble pooping (having a bowel movement). This may mean that you: ? Poop fewer times in a week than normal. ? Have a hard time pooping. ? Have poop that is dry, hard, or bigger than normal. Get help right away if:  You get a rash.  You feel light-headed.  You feel like you might pass out (faint).  You are short  of breath.  You have trouble breathing.  You are confused.  You have trouble talking.  You have problems with your seeing (vision).  You are not able to move.  You lose feeling (have numbness) in your: ? Face. ? Arms. ? Legs.  You pass out.  You have a sudden, bad headache.  You feel weak.  You have chest pain.  You have pain that: ? Is very bad. ? Gets worse, even with medicine. Summary  Take deep breaths, do breathing exercises, and cough often. This helps prevent lung infection (pneumonia).  Do not drive until your doctor approves. Do not travel by airplane for 2 weeks after your chest tube is removed, or until your doctor says that this is safe.  Check your cut from surgery every day for signs of infection.  Eat a healthy diet. This includes fresh fruits and vegetables, whole grains, and low-fat (lean) proteins. This information is not intended to replace advice given to you by your health care provider. Make sure you discuss  any questions you have with your health care provider. Document Released: 11/03/2011 Document Revised: 01/27/2016 Document Reviewed: 01/27/2016 Elsevier Interactive Patient Education  2017 [employer].

## 2017-06-30 NOTE — Progress Notes (Signed)
#   3.  S/W LENISE @ [facility] # [phone] OPT- 2     1. ELIQUIS  5 MG BID  COVER- YES  CO-PAY- $ 47.00  TIER- 3 DRUG  PRIOR APPROVAL- NO   2. APIXABAN : NONE FORMULARY   PREFERRED PHARMACY : [facility]

## 2017-06-30 NOTE — Progress Notes (Signed)
Progress Note  Patient Name: Krista Grimes Date of Encounter: 06/30/2017  Primary Cardiologist: Sanda Klein, MD   Subjective   No serious issues overnight, cough is better. Had atrial fibrillation w RVR yesterday evening until about midnight, asymptomatic. Currently NSR 84 bpm.  Inpatient Medications    Scheduled Meds: . atorvastatin  40 mg Oral q1800  . bisacodyl  10 mg Oral Daily  . cholecalciferol  1,000 Units Oral Daily  . clopidogrel  75 mg Oral Daily  . diltiazem  240 mg Oral Daily  . enoxaparin (LOVENOX) injection  40 mg Subcutaneous Daily  . escitalopram  20 mg Oral Daily  . guaiFENesin  1,200 mg Oral BID  . insulin aspart  0-15 Units Subcutaneous TID WC  . levalbuterol  0.63 mg Nebulization TID  . metoprolol tartrate  25 mg Oral TID  . potassium chloride  20 mEq Oral Daily  . senna-docusate  1 tablet Oral QHS  . sodium chloride flush  3 mL Intravenous Q12H  . vitamin B-12  1,000 mcg Oral Daily   Continuous Infusions: . bupivacaine 0.5 % ON-Q pump SINGLE CATH 400 mL    . dextrose 5 % and 0.45% NaCl Stopped (06/28/17 0715)  . diltiazem (CARDIZEM) infusion Stopped (06/29/17 0725)   PRN Meds: hydrALAZINE, oxyCODONE, potassium chloride, sodium chloride flush, traMADol   Vital Signs    Vitals:   06/30/17 0500 06/30/17 0600 06/30/17 0700 06/30/17 0808  BP: 137/75     Pulse: 74 85 81   Resp: 19 19 19    Temp:    98.6 F (37 C)  TempSrc:    Oral  SpO2: 97% 97% 97%   Weight:      Height:        Intake/Output Summary (Last 24 hours) at 06/30/2017 0811 Last data filed at 06/30/2017 0600 Gross per 24 hour  Intake 240 ml  Output 600 ml  Net -360 ml   Filed Weights   06/24/17 0558  Weight: 187 lb (84.8 kg)    Telemetry    AFib w RVR/NSR - Personally Reviewed  ECG    No new tracing - Personally Reviewed  Physical Exam  Appears comfortable GEN: No acute distress.   Neck: No JVD Cardiac: RRR, faint systolic ejection murmur, no murmurs,  rubs, or gallops.  Respiratory: Clear to auscultation bilaterally. GI: Soft, nontender, non-distended  MS: No edema; No deformity. Neuro:  Nonfocal  Psych: Normal affect   Labs    Chemistry Recent Labs  Lab 06/26/17 0224  06/28/17 0506 06/29/17 0209 06/30/17 0236  NA 139   < > 140 140 137  K 4.5   < > 3.8 3.6 3.7  CL 102   < > 105 104 102  CO2 26   < > 26 25 25   GLUCOSE 138*   < > 126* 143* 128*  BUN 14   < > 7 9 11   CREATININE 0.97   < > 0.56 0.54 0.62  CALCIUM 9.2   < > 8.5* 8.7* 8.6*  PROT 5.9*  --   --   --   --   ALBUMIN 3.1*  --   --   --   --   AST 28  --   --   --   --   ALT 14  --   --   --   --   ALKPHOS 57  --   --   --   --   BILITOT 1.0  --   --   --   --  GFRNONAA 58*   < > >60 >60 >60  GFRAA >60   < > >60 >60 >60  ANIONGAP 11   < > 9 11 10    < > = values in this interval not displayed.     Hematology Recent Labs  Lab 06/26/17 0224 06/27/17 0235 06/28/17 0506  WBC 13.3* 10.2 9.5  RBC 3.22* 2.96* 3.06*  HGB 10.1* 9.5* 9.6*  HCT 33.5* 29.8* 30.6*  MCV 104.0* 100.7* 100.0  MCH 31.4 32.1 31.4  MCHC 30.1 31.9 31.4  RDW 14.3 14.1 14.2  PLT 184 159 198    Cardiac EnzymesNo results for input(s): TROPONINI in the last 168 hours. No results for input(s): TROPIPOC in the last 168 hours.   BNPNo results for input(s): BNP, PROBNP in the last 168 hours.   DDimer No results for input(s): DDIMER in the last 168 hours.   Radiology    Dg Chest 2 View  Result Date: 06/30/2017 CLINICAL DATA:  Shortness of breath. Status post right upper lobectomy. EXAM: CHEST  2 VIEW COMPARISON:  06/29/2017 FINDINGS: Left subclavian Port-A-Cath terminates over the mid SVC. The cardiac silhouette is normal in size. Sequelae of right upper lobectomy are again identified. Moderate-sized right pneumothorax is similar to the prior study. There is a persistent small right pleural effusion. Right lung base aeration has mildly improved. No acute consolidation is seen in the left  lung. IMPRESSION: 1. Similar appearance of right hydropneumothorax status post upper lobectomy. 2. Mildly improved right lung base aeration. Electronically Signed   By: Logan Bores M.D.   On: 06/30/2017 07:43   Dg Chest Port 1 View  Result Date: 06/29/2017 CLINICAL DATA:  Status post right chest tube removal EXAM: PORTABLE CHEST 1 VIEW COMPARISON:  06/29/2017 FINDINGS: Cardiac shadow is stable. Left chest wall port is again seen. Postsurgical changes are noted on the right with stable appearing pneumothorax. The right-sided chest tube has been removed in the interval. No new focal abnormality is seen. IMPRESSION: Stable appearing right pneumothorax following chest tube removal. Postsurgical changes are noted on the right. Electronically Signed   By: Inez Catalina M.D.   On: 06/29/2017 11:10   Dg Chest Port 1 View  Result Date: 06/29/2017 CLINICAL DATA:  Status post right lobectomy with chest tube in place EXAM: PORTABLE CHEST 1 VIEW COMPARISON:  June 28, 2017. FINDINGS: There is postoperative change on the right with chest tube remaining on the right. There is a persistent moderate pneumothorax on the right without tension component. There is volume loss on the right with consolidation in the right base and right pleural effusion. The left lung is mildly hyperexpanded clear. Port-A-Cath tip is in the superior vena cava. Heart size and pulmonary vascularity are normal. No adenopathy is evident by radiography. There is bilateral carotid artery calcification. IMPRESSION: Postoperative change on the right with moderate pneumothorax. No tension component. Chest tube remains in place. There remains volume loss and consolidation in the right lower lung zone region with small right pleural effusion. Surgical clips are present overlying the right perihilar region. Left lung hyperexpanded but clear. Stable cardiac silhouette. Port-A-Cath tip in superior vena cava. There is calcification in each carotid artery.  Electronically Signed   By: Lowella Grip III M.D.   On: 06/29/2017 08:13    Cardiac Studies   ECHO 06/30/2017  - Left ventricle: The cavity size was normal. Wall thickness was   normal. Systolic function was normal. The estimated ejection   fraction was in the range  of 60% to 65%. Wall motion was normal;   there were no regional wall motion abnormalities. Doppler   parameters are consistent with abnormal left ventricular   relaxation (grade 1 diastolic dysfunction). - Aortic valve: Trileaflet; mildly thickened, mildly calcified   leaflets. - Aorta: The aorta was mildly calcified. Ascending aortic diameter:   38 mm (S). - Ascending aorta: The ascending aorta was mildly dilated. - Pericardium, extracardiac: A trivial pericardial effusion was   identified.  Patient Profile     72 y.o. female with minimally symptomatic recurrent paroxysms of atrial fibrillation with RVR in the postop period after RULobectomy for squamous cell lung Ca, history of CVA x 2, HTN.  Assessment & Plan    1. AFib: although rate was better, still had RVR last PM. Increase diltiazem. Continue metoprolol (avoid high doses due to lung disease). CHADSVasc 6. 2. HTN: stop lisinopril to allow the higher doses of AV blocking agents. 3. Hx CVA:  Stop clopidogrel. Add Eliquis 5 mg BID if OK w CV surgery. Chest tube is out.  For questions or updates, please contact [place] Please consult [email or web address] for contact info under Cardiology/STEMI.      Signed, Sanda Klein, MD  06/30/2017, 8:11 AM

## 2017-06-30 NOTE — Progress Notes (Signed)
Report called to RN on 4 E. Will take patient up via wheelchair with telemetry monitoring.

## 2017-06-30 NOTE — Plan of Care (Signed)
Patient doing well, ambulating, on room air, continues to have some pain issues, treated with oxycodone nad positioning. Less hemoptysis. Awaiting a bed on SDU

## 2017-06-30 NOTE — Discharge Summary (Signed)
Physician Discharge Summary       [address]       [place] [address]             [phone]    Patient ID: Krista Grimes MRN: [id number] DOB/AGE: [birth year] 70 y.o.  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  Discharge Diagnoses:  1. S/P lobectomy of lung 2. Paroxysmal atrial fibrillation 3. COPD (chronic obstructive pulmonary disease) ([place]) 4. History of tobacco abuse 5. History of hyperlipidemia 6. History of hypertension 7. History of cerebrovascular disease 8.History of depression 9. History of anxiety 10. History of arthritis   Consults: cardiology  Procedure (s):  1. Right thoracotomy. 2. Right upper lobectomy with en bloc resection of chest wall soft     tissue. 3. Mediastinal lymph node dissection. 4. On-Q local anesthetic catheter placement by Dr. Roxan Hockey on 06/24/2017.  Pathology: 1. Lymph node, biopsy, 8 - NO CARCINOMA IDENTIFIED IN ONE LYMPH NODE (0/1) 2. Lymph node, biopsy, 9 - NO CARCINOMA IDENTIFIED IN ONE LYMPH NODE (0/1) 3. Soft tissue, biopsy, Right Anterior Chest Wall Margins - BENIGN SKELETAL MUSCLE AND FIBROADIPOSE TISSUE - NO CARCINOMA IDENTIFIED 4. Soft tissue, biopsy, Right Posterior Chest Wall Margins - BENIGN SOFT TISSUE AND NERVE - NO CARCINOMA IDENTIFIED 5. Lung, resection (segmental or lobe), RUL margin - LUNG TISSUE WITH REACTIVE CHANGES, FIBROSIS, CHRONIC INFLAMMATION WITH FOREIGN BODY GIANT CELL REACTION - EMPHYSEMATOUS CHANGES - NO RESIDUAL CARCINOMA IDENTIFIED 6. Lung, biopsy, Right apical - CAUTERIZED TISSUE - NO CARCINOMA IDENTIFIED 7. Lymph node, biopsy, 7 - NO CARCINOMA IDENTIFIED IN ONE LYMPH NODE (0/1) 8. Lymph node, biopsy, 7 #2 - NO CARCINOMA IDENTIFIED IN ONE LYMPH NODE (0/1) 9. Lymph node, biopsy, 10 R - NO CARCINOMA IDENTIFIED IN ONE LYMPH NODE (0/1) 10. Lymph node,  biopsy, 10 R #2 - NO CARCINOMA IDENTIFIED IN ONE LYMPH NODE (0/1) 11. Lymph node, biopsy, 11 R - NO CARCINOMA IDENTIFIED IN ONE LYMPH NODE (0/1) 12. Lymph node, biopsy, 12 R NO CARCINOMA IDENTIFIED IN ONE LYMPH NODE (0/1) 13. Lymph node, biopsy, 11 R #2 - NO CARCINOMA IDENTIFIED IN ONE LYMPH NODE (0/1) 14. Lymph node, biopsy, 12 R #2 - NO CARCINOMA IDENTIFIED IN ONE LYMPH NODE (0/1)  TNM code: ypT0, ypN0   History of Presenting Illness: Krista Grimes is a 72 year old woman with a 100-pack-year history of tobacco abuse (2 packs/day from age 72 until about 6 months ago), COPD, prior stroke, arthritis, hypertension, hyperlipidemia, skin cancer, anxiety, depression and asthma.  She presented with a complaint of right chest pain radiating into the right arm back in August.  CT of the chest showed a 5 cm right upper lobe mass adjacent to the chest wall without bony destruction.  PET/CT showed this mass was hypermetabolic with an SUV of 21.  There is no evidence of regional or distant metastases.  Biopsy demonstrated squamous cell carcinoma.  She was treated with concurrent chemoradiation by Drs. Bobby Rumpf and [facility] at [facility].  She tolerated treatment well without any major side effects.  She had resolution of her pain.  Follow-up CT showed a significant response to treatment.  She has a history of a stroke.  She has expressive aphasia.  She has some limitation of movement in her right hand.  She is able to ambulate without difficulty.  She denies chest pain, pressure, or tightness.  She has no history of cardiac disease.  She does have coronary atherosclerosis on her scans.  She does not get short of breath with routine activities and could walk a flight of steps without stopping.  She has not had any change in appetite or weight loss.  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  She is  well aware of the pain that is possible with this type of surgery.  She would require a right upper lobectomy.  In all likelihood, we will need to do a thoracotomy and chest wall resection.  Dr. Roxan Hockey discussed the general nature of the procedure including the incisions to be used, the need for general anesthesia, the use of drainage tubes postoperatively, the expected hospital stay, and the overall recovery.  They understand that chronic pain is a very strong possibility.  Dr. Roxan Hockey informed them of the indications, risks, benefits, and alternatives.  They understand the potential risks, comlpications, and benefits of the surgery and agree to proceed. She was admitted on 06/24/2017 in order to undergo the aforementioned surgery.   Brief Hospital Course:  The patient remained afebrile and hemodynamically stable. A line  was removed early in the post operative course. Foley was removed but but had to be re interted secondary to urinary retention. Chest tube output gradually decreased. She did go into a fib with RVR and then subsequently had PAF. She was put on IV Amiodarone initially. Cardilogy was consulted. She was then put on IV Diltiazem. She was also on Lopressor 25 mg tid and she was transitioned to oral Cardizem. She was started on Eliquis on 06/30/2017. She did have a small air leak. Chest tubes were placed to water seal on 02/08. Daily chest x rays were obtained and remained stable. Posterior chest tube was removed on 06/29/2015. Remaining chest tube was removed on 06/29/2017. Patient is ambulating on room air. Patient is tolerating a diet and has had a bowel movement. Wounds are clean and dry. Final chest X ray done today showed COPD, slightly decreased volume of the right sided hydropneumothorax, and there is minimal shift of the mediastinum toward the right which is stable. As discussed with Dr. Roxan Hockey, the patient is felt surgically stable for discharge today.   Latest Vital  Signs: Blood pressure (!) 142/96, pulse (!) 103, temperature 98.2 F (36.8 C), temperature source Oral, resp. rate 18, height 5\' 8"  (1.727 m), weight 198 lb 3.1 oz (89.9 kg), SpO2 94 %.  Physical Exam: General appearance: alert, cooperative and no distress Neurologic: intact Heart: regular rate and rhythm Lungs: diminished breath sounds on right Wound: clean and dry     Discharge Condition: Stable and discharged to home.  Recent laboratory studies:  Lab Results  Component Value Date   WBC 9.5 06/28/2017   HGB 9.6 (L) 06/28/2017   HCT 30.6 (L) 06/28/2017   MCV 100.0 06/28/2017   PLT 198 06/28/2017   Lab Results  Component Value Date   NA 137 06/30/2017   K 3.7 06/30/2017   CL 102 06/30/2017   CO2 25 06/30/2017   CREATININE 0.62 06/30/2017   GLUCOSE 128 (H) 06/30/2017    Diagnostic Studies: Dg Chest 2 View  Result Date: 07/01/2017 CLINICAL DATA:  Shortness of breath, history of asthma-COPD, lung malignancy on the right. Known right-sided hydropneumothorax. EXAM: CHEST  2 VIEW COMPARISON:  PA and lateral chest x-ray of June 30, 2017 FINDINGS: The right-sided hydropneumothorax is slightly less conspicuous  today. There remains tenting of the right hemidiaphragm. There is minimal shift of the mediastinum toward the right which is stable. The left lung is hyperinflated and stable in appearance. There is apical pleural thickening on the left. The heart and pulmonary vascularity are normal. There calcification in the wall of the aortic arch. The power port catheter tip projects over the midportion of the SVC. There surgical clips in the mediastinum. IMPRESSION: The COPD. Slightly decreased volume of the right-sided hydropneumothorax. No acute pneumonia. Thoracic aortic atherosclerosis. Electronically Signed   By: David  Martinique M.D.   On: 07/01/2017 10:22   Dg Chest 2 View  Result Date: 06/30/2017 CLINICAL DATA:  Shortness of breath. Status post right upper lobectomy. EXAM: CHEST   2 VIEW COMPARISON:  06/29/2017 FINDINGS: Left subclavian Port-A-Cath terminates over the mid SVC. The cardiac silhouette is normal in size. Sequelae of right upper lobectomy are again identified. Moderate-sized right pneumothorax is similar to the prior study. There is a persistent small right pleural effusion. Right lung base aeration has mildly improved. No acute consolidation is seen in the left lung. IMPRESSION: 1. Similar appearance of right hydropneumothorax status post upper lobectomy. 2. Mildly improved right lung base aeration. Electronically Signed   By: Logan Bores M.D.   On: 06/30/2017 07:43    Discharge Medications: Allergies as of 07/01/2017      Reactions   Codeine Nausea And Vomiting      Medication List    STOP taking these medications   clopidogrel 75 MG tablet Commonly known as:  PLAVIX   UNKNOWN TO PATIENT     TAKE these medications   albuterol 108 (90 Base) MCG/ACT inhaler Commonly known as:  PROVENTIL HFA;VENTOLIN HFA Inhale 1-2 puffs into the lungs every 6 (six) hours as needed for wheezing or shortness of breath.   apixaban 5 MG Tabs tablet Commonly known as:  ELIQUIS Take 1 tablet (5 mg total) by mouth 2 (two) times daily.   atorvastatin 40 MG tablet Commonly known as:  LIPITOR Take 40 mg by mouth daily.   cholecalciferol 1000 units tablet Commonly known as:  VITAMIN D Take 1,000 Units by mouth daily.   diltiazem 240 MG 24 hr capsule Commonly known as:  CARDIZEM CD Take 1 capsule (240 mg total) by mouth daily. Start taking on:  07/02/2017   escitalopram 20 MG tablet Commonly known as:  LEXAPRO Take 20 mg by mouth daily.   lisinopril 10 MG tablet Commonly known as:  PRINIVIL,ZESTRIL Take 1 tablet (10 mg total) by mouth daily. What changed:    medication strength  how much to take   metoprolol tartrate 25 MG tablet Commonly known as:  LOPRESSOR Take 1.5 tablets (37.5 mg total) by mouth 2 (two) times daily.   Omega 3 1000 MG Caps Take  1,000 mg by mouth daily.   traMADol 50 MG tablet Commonly known as:  ULTRAM Take 50 mg by mouth every 4-6 hours PRN moderate to severe pain.   vitamin B-12 1000 MCG tablet Commonly known as:  CYANOCOBALAMIN Take 1,000 mcg by mouth daily.       Follow Up Appointments: Follow-up Information    [name], MD. Go on 07/20/2017.   Specialty:  Cardiothoracic Surgery Why:  PA/LAT CXR to be taken (at [facility] which is in the same building as Dr. Leonarda Salon office on 07/20/2017 at 11:15 am;Appointment time is at 11:45 am Contact information: [place] [address] [phone]  Nurse. Daphane Shepherd on 07/07/2017.   Why:  Appointment is with nurse only to have chest tube sutures removed. Appointment time is at 10:45 am Contact information: [place] [address]       Barrett, Rhonda G, [place]. Go on 07/19/2017.   Specialties:  Cardiology, Radiology Why:  Appointment time is at 11:00 am Contact information: [place] [address] [phone]           Signed: Sharalyn Ink [facility] 07/01/2017, 12:31 PM

## 2017-07-01 ENCOUNTER — Inpatient Hospital Stay (HOSPITAL_COMMUNITY): Payer: Medicare HMO

## 2017-07-01 LAB — GLUCOSE, CAPILLARY
GLUCOSE-CAPILLARY: 121 mg/dL — AB (ref 65–99)
GLUCOSE-CAPILLARY: 117 mg/dL — AB (ref 65–99)

## 2017-07-01 MED ORDER — DILTIAZEM HCL ER COATED BEADS 240 MG PO CP24: 240.0000 mg | ORAL_CAPSULE | Freq: Every day | ORAL | 1 refills | Status: DC

## 2017-07-01 MED ORDER — METOPROLOL TARTRATE 25 MG PO TABS: 37.5000 mg | ORAL_TABLET | Freq: Two times a day (BID) | ORAL | 1 refills | Status: DC

## 2017-07-01 MED ORDER — TRAMADOL HCL 50 MG PO TABS: ORAL_TABLET | ORAL | 0 refills | Status: AC

## 2017-07-01 MED ORDER — APIXABAN 5 MG PO TABS: 5.0000 mg | ORAL_TABLET | Freq: Two times a day (BID) | ORAL | 1 refills | Status: DC

## 2017-07-01 MED ORDER — LISINOPRIL 10 MG PO TABS: 10.0000 mg | ORAL_TABLET | Freq: Every day | ORAL | 1 refills | Status: DC

## 2017-07-01 NOTE — Progress Notes (Signed)
Patient ambulated 583ft in the hall using a walker on room air, patient c/o of SOB during ambulation, O2 sat on getting to the room, on room air was 93%. Patient educated on the need  to keep using her flutter valve and incentive spirometer, she verbalized understanding, bed in lowest position, call bell within reach will continue to monitor.

## 2017-07-01 NOTE — Care Management Important Message (Signed)
Important Message  Patient Details  Name: Krista Grimes MRN: [id number] Date of Birth: 04/11/1946   Medicare Important Message Given:  Yes    Orbie Pyo 07/01/2017, 12:17 PM

## 2017-07-01 NOTE — Progress Notes (Signed)
Progress Note  Patient Name: Krista Grimes Date of Encounter: 07/01/2017  Primary Cardiologist: Sanda Klein, MD   Subjective   Denies SOB, she was unaware of her AF  Inpatient Medications    Scheduled Meds: . apixaban  5 mg Oral BID  . atorvastatin  40 mg Oral q1800  . bisacodyl  10 mg Oral Daily  . cholecalciferol  1,000 Units Oral Daily  . diltiazem  240 mg Oral Daily  . escitalopram  20 mg Oral Daily  . metoprolol tartrate  25 mg Oral TID  . potassium chloride  20 mEq Oral Daily  . senna-docusate  1 tablet Oral QHS  . sodium chloride flush  3 mL Intravenous Q12H  . vitamin B-12  1,000 mcg Oral Daily   Continuous Infusions: . dextrose 5 % and 0.45% NaCl Stopped (06/28/17 0715)  . diltiazem (CARDIZEM) infusion Stopped (06/29/17 0725)   PRN Meds: hydrALAZINE, levalbuterol, oxyCODONE, potassium chloride, sodium chloride flush, traMADol   Vital Signs    Vitals:   07/01/17 0350 07/01/17 0500 07/01/17 0745 07/01/17 0746  BP: 111/61  (!) 142/96 (!) 142/96  Pulse:  68 99 (!) 103  Resp:  14 (!) 22 18  Temp: 98.6 F (37 C)  98.2 F (36.8 C)   TempSrc: Axillary  Oral   SpO2:  94% 91% 94%  Weight: 198 lb 3.1 oz (89.9 kg)     Height:        Intake/Output Summary (Last 24 hours) at 07/01/2017 1026 Last data filed at 07/01/2017 0730 Gross per 24 hour  Intake 240 ml  Output 1201 ml  Net -961 ml   Filed Weights   06/24/17 0558 07/01/17 0350  Weight: 187 lb (84.8 kg) 198 lb 3.1 oz (89.9 kg)    Telemetry    AFib w RVR earlier, NSR now with occasional PAC - Personally Reviewed  ECG    No new tracing - Personally Reviewed  Physical Exam  Appears comfortable GEN: No acute distress.   Neck: No JVD Cardiac: RRR, faint systolic ejection murmur, no murmurs, rubs, or gallops.  Respiratory: Clear to auscultation bilaterally. RUL surgical wound GI: Soft, nontender, non-distended  MS: No edema; No deformity. Neuro:  Nonfocal  Psych: Normal affect   Labs    Chemistry Recent Labs  Lab 06/26/17 0224  06/28/17 0506 06/29/17 0209 06/30/17 0236  NA 139   < > 140 140 137  K 4.5   < > 3.8 3.6 3.7  CL 102   < > 105 104 102  CO2 26   < > 26 25 25   GLUCOSE 138*   < > 126* 143* 128*  BUN 14   < > 7 9 11   CREATININE 0.97   < > 0.56 0.54 0.62  CALCIUM 9.2   < > 8.5* 8.7* 8.6*  PROT 5.9*  --   --   --   --   ALBUMIN 3.1*  --   --   --   --   AST 28  --   --   --   --   ALT 14  --   --   --   --   ALKPHOS 57  --   --   --   --   BILITOT 1.0  --   --   --   --   GFRNONAA 58*   < > >60 >60 >60  GFRAA >60   < > >60 >60 >60  ANIONGAP 11   < >  9 11 10    < > = values in this interval not displayed.     Hematology Recent Labs  Lab 06/26/17 0224 06/27/17 0235 06/28/17 0506  WBC 13.3* 10.2 9.5  RBC 3.22* 2.96* 3.06*  HGB 10.1* 9.5* 9.6*  HCT 33.5* 29.8* 30.6*  MCV 104.0* 100.7* 100.0  MCH 31.4 32.1 31.4  MCHC 30.1 31.9 31.4  RDW 14.3 14.1 14.2  PLT 184 159 198    Cardiac EnzymesNo results for input(s): TROPONINI in the last 168 hours. No results for input(s): TROPIPOC in the last 168 hours.   BNPNo results for input(s): BNP, PROBNP in the last 168 hours.   DDimer No results for input(s): DDIMER in the last 168 hours.   Radiology    Dg Chest 2 View  Result Date: 07/01/2017 CLINICAL DATA:  Shortness of breath, history of asthma-COPD, lung malignancy on the right. Known right-sided hydropneumothorax. EXAM: CHEST  2 VIEW COMPARISON:  PA and lateral chest x-ray of June 30, 2017 FINDINGS: The right-sided hydropneumothorax is slightly less conspicuous today. There remains tenting of the right hemidiaphragm. There is minimal shift of the mediastinum toward the right which is stable. The left lung is hyperinflated and stable in appearance. There is apical pleural thickening on the left. The heart and pulmonary vascularity are normal. There calcification in the wall of the aortic arch. The power port catheter tip projects over the midportion  of the SVC. There surgical clips in the mediastinum. IMPRESSION: The COPD. Slightly decreased volume of the right-sided hydropneumothorax. No acute pneumonia. Thoracic aortic atherosclerosis. Electronically Signed   By: David  Martinique M.D.   On: 07/01/2017 10:22   Dg Chest 2 View  Result Date: 06/30/2017 CLINICAL DATA:  Shortness of breath. Status post right upper lobectomy. EXAM: CHEST  2 VIEW COMPARISON:  06/29/2017 FINDINGS: Left subclavian Port-A-Cath terminates over the mid SVC. The cardiac silhouette is normal in size. Sequelae of right upper lobectomy are again identified. Moderate-sized right pneumothorax is similar to the prior study. There is a persistent small right pleural effusion. Right lung base aeration has mildly improved. No acute consolidation is seen in the left lung. IMPRESSION: 1. Similar appearance of right hydropneumothorax status post upper lobectomy. 2. Mildly improved right lung base aeration. Electronically Signed   By: Logan Bores M.D.   On: 06/30/2017 07:43   Dg Chest Port 1 View  Result Date: 06/29/2017 CLINICAL DATA:  Status post right chest tube removal EXAM: PORTABLE CHEST 1 VIEW COMPARISON:  06/29/2017 FINDINGS: Cardiac shadow is stable. Left chest wall port is again seen. Postsurgical changes are noted on the right with stable appearing pneumothorax. The right-sided chest tube has been removed in the interval. No new focal abnormality is seen. IMPRESSION: Stable appearing right pneumothorax following chest tube removal. Postsurgical changes are noted on the right. Electronically Signed   By: Inez Catalina M.D.   On: 06/29/2017 11:10    Cardiac Studies   ECHO 06/30/2017  - Left ventricle: The cavity size was normal. Wall thickness was   normal. Systolic function was normal. The estimated ejection   fraction was in the range of 60% to 65%. Wall motion was normal;   there were no regional wall motion abnormalities. Doppler   parameters are consistent with abnormal  left ventricular   relaxation (grade 1 diastolic dysfunction). - Aortic valve: Trileaflet; mildly thickened, mildly calcified   leaflets. - Aorta: The aorta was mildly calcified. Ascending aortic diameter:   38 mm (S). - Ascending aorta: The  ascending aorta was mildly dilated. - Pericardium, extracardiac: A trivial pericardial effusion was   identified.  Patient Profile     72 y.o. female with minimally symptomatic recurrent paroxysms of atrial fibrillation with RVR in the postop period after RULobectomy for squamous cell lung Ca, history of CVA x 2, HTN.  Assessment & Plan    1. PAF: Diltiazem increased. Continue metoprolol (avoid high doses due to lung disease). CHADSVasc 6. She had prolonged QTc on Amiodarone when she developed post op AF, now NSR on Lopressor and Diltiazem.   2. HTN: stop lisinopril to allow the higher doses of AV blocking agents.  3. Hx CVA:  Plavix stopped, Eliquis added   4. Squamous cell lung CA- s/p RUL lobectomy 06/24/17  Plan: Stable from cardiac standpoint, we will arrange for OP follow up.   For questions or updates, please contact [place] Please consult [email or web address] for contact info under Cardiology/STEMI.      Signed, Kerin Ransom, PA-C  07/01/2017, 10:26 AM    I have seen and examined the patient along with Kerin Ransom, PA.  I have reviewed the chart, notes and new data.  I agree with PA/NP's note.  Key new complaints: Oblivious to the arrhythmia.  She is going in and out of atrial fibrillation repeatedly this morning. Key examination changes: Irregular rhythm, spontaneously changed to regular rhythm during physical exam Key new findings / data: When in atrial fibrillation the ventricular rate is around 95-105 bpm, in normal sinus rhythm the rate is around 60 bpm  PLAN: Current medications appear to be achieving the correct balance between excessive bradycardia during sinus rhythm and excessive tachycardia during atrial fibrillation.  May  need to fine tune these medicines as an outpatient, but I think she is ready for discharge.  Blood pressure was very normal last night without lisinopril.  Elevated diastolic blood pressure this morning transiently.  We will leave her off the lisinopril at least for the time being. Reviewed the risk of minor bleeding/nuisance bleeding with anticoagulation, pointed out types of bleeding that should prompt urgent medical evaluation.  Sanda Klein, MD, [place] [phone] 07/01/2017, 11:34 AM

## 2017-07-01 NOTE — Progress Notes (Signed)
Patient in a stable condition, discharge education reviewed with patient and her husband at bedside, they verbalized understanding, iv removed , tele dc ccmd notified, patient belongings at bedside, patient to be transported home by her family

## 2017-07-01 NOTE — Care Management Note (Signed)
Case Management Note Previous CM note completed by Zenon Mayo, RN--06/30/2017, 10:50 AM   Patient Details  Name: Krista Grimes MRN: [id number] Date of Birth: 1945-09-27  Subjective/Objective:   From home with spouse, post op R Thoracotomy , lobectomy , chest wall resection.    2/11 [place], BSN - POD 4 Thoracotomy , RU Lobectomy ,conts with anterior chest tube to water seal. Patient states she will be going home with daughter, Shellee Milo to assist her at discharge.  She has a walker at home that belonged to her mother.  She states she has a PCP and medication coverage.     2/13 [place], BSN- Eliquis is new medication for patient, NCM awaiting benefit check.  Preferred pharmacy is [facility] in [place] and they do have eliquis in [place].                              Action/Plan: NCM will follow for transition of care.   Expected Discharge Date:  07/01/17               Expected Discharge Plan:  Home/Self Care  In-House Referral:  NA  Discharge planning Services  CM Consult, Medication Assistance  Post Acute Care Choice:  NA Choice offered to:  NA  DME Arranged:    DME Agency:     HH Arranged:    HH Agency:     Status of Service:  Completed, signed off  If discussed at [employer] of Stay Meetings, dates discussed:    Discharge Disposition: home/self care   Additional Comments:  07/01/17- 1230- Jaquavian Firkus RN, CM- pt for d/c home today- spoke with pt at bedside discussed Eliquis and copay cost- provided pt 30 day free card to use on discharge- pt states she is going to her daughter's home in [place] to stay awhile and has a walker at home to use if needed.   Marvetta Gibbons [place], RN 07/01/2017, 12:31 PM [phone] 4E Transition Care Coordinator

## 2017-07-01 NOTE — Progress Notes (Signed)
Pt had an episode of Afib with RVR after walking to the bathroom. After back in bed resting back converted back to NSR. Will continue to monitor. Isac Caddy, RN

## 2017-07-01 NOTE — Progress Notes (Addendum)
      [address]       [place] [address]             [phone]       7 Days Post-Op Procedure(s) (LRB): THORACOTOMY MAJOR (Right) RIGHT UPPER LOBECTOMY (Right)  Subjective: Patient sleeping and awakened. She has no specific complaints this am.  Objective: Vital signs in last 24 hours: Temp:  [98.2 F (36.8 C)-99.2 F (37.3 C)] 98.6 F (37 C) (02/14 0350) Pulse Rate:  [71-91] 79 (02/13 2156) Cardiac Rhythm: Atrial fibrillation;Bundle branch block (02/14 0257) Resp:  [14-21] 15 (02/13 1731) BP: (111-148)/(60-92) 111/61 (02/14 0350) SpO2:  [95 %-98 %] 95 % (02/13 1731) Weight:  [198 lb 3.1 oz (89.9 kg)] 198 lb 3.1 oz (89.9 kg) (02/14 0350)      Intake/Output from previous day: 02/13 0701 - 02/14 0700 In: 240 [P.O.:240] Out: 650 [Urine:650]   Physical Exam:  Cardiovascular: RRR Pulmonary: Clear to auscultation on left and diminished on right Abdomen: Soft, non tender, bowel sounds present. Extremities: Trace bilateral lower extremity edema. Wound: Clean and dry.     Lab Results: CBC:No results for input(s): WBC, HGB, HCT, PLT in the last 72 hours. BMET:  Recent Labs    06/29/17 0209 06/30/17 0236  NA 140 137  K 3.6 3.7  CL 104 102  CO2 25 25  GLUCOSE 143* 128*  BUN 9 11  CREATININE 0.54 0.62  CALCIUM 8.7* 8.6*    PT/INR: No results for input(s): LABPROT, INR in the last 72 hours. ABG:  INR: Will add last result for INR, ABG once components are confirmed Will add last 4 CBG results once components are confirmed  Assessment/Plan:  1. CV - PAF. Had A fib with RVR earlier this am. SR in the 70's at the time of my exam. On Cardizem CD 240 mg daily (just changed dose yesterday), Lopressor 25 mg tid, and Apixaban 5 mg po bid. 2.  Pulmonary - On room air this am. Encourage incentive spirometer. 3. Hopefully, home soon Sharalyn Ink [facility] 07/01/2017,7:16 AM   Ambulated 540' this morning Will dc home on Eliquis  Path showed  no residual tumor  Remo Lipps C. Roxan Hockey, MD [facility] [phone]

## 2017-07-02 LAB — BPAM RBC
BLOOD PRODUCT EXPIRATION DATE: 201902152359
Blood Product Expiration Date: 201902242359
ISSUE DATE / TIME: 201902070751
ISSUE DATE / TIME: 201902121411
UNIT TYPE AND RH: 6200
Unit Type and Rh: 6200

## 2017-07-02 LAB — TYPE AND SCREEN
ABO/RH(D): A POS
Antibody Screen: NEGATIVE
UNIT DIVISION: 0
Unit division: 0

## 2017-07-07 ENCOUNTER — Ambulatory Visit (INDEPENDENT_AMBULATORY_CARE_PROVIDER_SITE_OTHER): Payer: Self-pay

## 2017-07-07 ENCOUNTER — Other Ambulatory Visit: Payer: Self-pay

## 2017-07-07 DIAGNOSIS — Z4802 Encounter for removal of sutures: Secondary | ICD-10-CM

## 2017-07-07 NOTE — Progress Notes (Signed)
Patient arrived for nurse visit to remove 2 sutures post- procedure chest tube incision site.  Sutures removed with no signs/ symptoms of infection noted. One incision site was opened.  Site cleaned and bandaid placed.  Instructed patient and family to watch for signs of infection and call if needed. Patient tolerated procedure well.  Patient/ family instructed to keep the incision sites clean and dry.  Patient/ family acknowledged instructions given.

## 2017-07-19 ENCOUNTER — Encounter: Payer: Self-pay | Admitting: Physician Assistant

## 2017-07-19 ENCOUNTER — Ambulatory Visit (INDEPENDENT_AMBULATORY_CARE_PROVIDER_SITE_OTHER): Payer: Medicare HMO | Admitting: Physician Assistant

## 2017-07-19 ENCOUNTER — Other Ambulatory Visit: Payer: Self-pay | Admitting: Thoracic Surgery (Cardiothoracic Vascular Surgery)

## 2017-07-19 VITALS — BP 118/70 | HR 51 | Ht 68.0 in | Wt 179.0 lb

## 2017-07-19 DIAGNOSIS — Z7901 Long term (current) use of anticoagulants: Secondary | ICD-10-CM | POA: Diagnosis not present

## 2017-07-19 DIAGNOSIS — R0609 Other forms of dyspnea: Secondary | ICD-10-CM | POA: Diagnosis not present

## 2017-07-19 DIAGNOSIS — I480 Paroxysmal atrial fibrillation: Secondary | ICD-10-CM

## 2017-07-19 DIAGNOSIS — C3491 Malignant neoplasm of unspecified part of right bronchus or lung: Secondary | ICD-10-CM

## 2017-07-19 MED ORDER — METOPROLOL TARTRATE 25 MG PO TABS: 25.0000 mg | ORAL_TABLET | Freq: Two times a day (BID) | ORAL | 1 refills | Status: DC

## 2017-07-19 NOTE — Progress Notes (Signed)
Cardiology Office Note   Date:  07/19/2017   ID:  Krista Grimes, DOB 1945-07-25, MRN [id number]  PCP:  [name], NP  Cardiologist:  Dr Sallyanne Kuster, 07/01/2017  [name], PA-C   Chief Complaint  Patient presents with  . Hospitalization Follow-up    History of Present Illness: Krista Grimes is a 72 y.o. female with a history of PAF/RVR after RUL lobectomy for lung CA (squamous cell CA), CVA x 2, OA, COPD, HTN, HLD, asthma, anxiety, CHA2DS2VASc= (female, age x 1, HTN, CVA x 2)  Admitted 02/07-02/14/2019 for lung CA surgery, seen by cards then (first time). Prolonged QT on amio, lisinopril stopped to keep BP from going too low. Had been on Plavix for CVA, changed to Eliquis, in SR at discharge  Krista Grimes presents for cardiology follow up.  Her son is with her today.  She has some R breast soreness near her incision, has appt tomorrow w/ surgeons.  She can walk in the house without SOB, but not very far.  She has a long hallway, not able to get all the way down it with out getting short of breath.  Is not having to do stairs. Has not been to the basement, has not done laundry or other housework.  Her husband and daughter are helping in her care.  She has some lower extremity edema, mainly daytime.  She denies orthopnea.  She does not get light headed or dizzy.   She is compliant with her medications.   Her legs have not been swelling, occasionally wakes in the night, not sure if it is from chest pain pain 2nd surgery or SOB. Sx improve w/ position change.   She is compliant with flutter valve and incentive spirometer.  Has not had palpitations and has not had any presyncope or syncope.  Does not think she has had any more atrial fibrillation.    Past Medical History:  Diagnosis Date  . Anxiety   . Arthritis   . Asthma   . Cerebrovascular disease   . COPD (chronic obstructive pulmonary disease) ([place])   . Depression   . Hyperlipidemia   .  Hypertension   . Skin cancer   . 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 Stroke [facility])    x2    memory,speech affected    Past Surgical History:  Procedure Laterality Date  . ABDOMINAL HYSTERECTOMY    . HAND SURGERY    . LOBECTOMY Right 06/24/2017   Procedure: RIGHT UPPER LOBECTOMY;  Surgeon: [name], MD;  Location: [place];  Service: Thoracic;  Laterality: Right;  . port a cath placement  02/2017  . THORACOTOMY Right 06/24/2017   Procedure: THORACOTOMY MAJOR;  Surgeon: [name], MD;  Location: [place];  Service: Thoracic;  Laterality: Right;  . TUBAL LIGATION      Current Outpatient Medications  Medication Sig Dispense Refill  . albuterol (PROVENTIL HFA;VENTOLIN HFA) 108 (90 Base) MCG/ACT inhaler Inhale 1-2 puffs into the lungs every 6 (six) hours as needed for wheezing or shortness of breath.     Marland Kitchen apixaban (ELIQUIS) 5 MG TABS tablet Take 1 tablet (5 mg total) by mouth 2 (two) times daily. 60 tablet 1  . atorvastatin (LIPITOR) 40 MG tablet Take 40 mg by mouth daily.     . cholecalciferol (VITAMIN D) 1000 units tablet Take 1,000 Units by mouth daily.    Marland Kitchen diltiazem (CARDIZEM CD) 240 MG 24  hr capsule Take 1 capsule (240 mg total) by mouth daily. 30 capsule 1  . escitalopram (LEXAPRO) 20 MG tablet Take 20 mg by mouth daily.    Marland Kitchen lisinopril (PRINIVIL,ZESTRIL) 10 MG tablet Take 1 tablet (10 mg total) by mouth daily. 30 tablet 1  . metoprolol tartrate (LOPRESSOR) 25 MG tablet Take 1.5 tablets (37.5 mg total) by mouth 2 (two) times daily. 60 tablet 1  . Omega 3 1000 MG CAPS Take 1,000 mg by mouth daily.     . traMADol (ULTRAM) 50 MG tablet Take 50 mg by mouth every 4-6 hours PRN moderate to severe pain. 28 tablet 0  . vitamin B-12 (CYANOCOBALAMIN) 1000 MCG tablet Take 1,000 mcg by mouth daily.     No current facility-administered medications for this visit.     Allergies:   Codeine    Social History:  The patient  reports that she quit smoking  about 6 months ago. Her smoking use included cigarettes. She has a 100.00 pack-year smoking history. she has never used smokeless tobacco. She reports that she does not drink alcohol or use drugs.   Family History:  The patient's family history includes COPD in her father; Colon cancer in her mother; Lung cancer in her brother and father; Skin cancer in her brother.    ROS:  Please see the history of present illness. All other systems are reviewed and negative.    PHYSICAL EXAM: VS:  BP 118/70   Pulse (!) 51   Ht 5\' 8"  (1.727 m)   Wt 179 lb (81.2 kg)   BMI 27.22 kg/m  , BMI Body mass index is 27.22 kg/m. GEN: Well nourished, well developed, female in no acute distress  HEENT: normal for age  Neck: no JVD, no carotid bruit, no masses Cardiac: RRR; soft murmur, no rubs, or gallops Respiratory: decreased R>L bases bilaterally, normal work of breathing, few rales GI: soft, nontender, nondistended, + BS MS: no deformity or atrophy; no edema; distal pulses are 2+ in all 4 extremities   Skin: warm and dry, no rash; surgical incisions are healing well with no signs of infection. Neuro:  Strength and sensation are intact Psych: euthymic mood, full affect   EKG:  EKG is ordered today. The ekg ordered today demonstrates sinus bradycardia, heart rate 51, no acute ischemic changes and no Q waves   Recent Labs: 06/26/2017: ALT 14 06/27/2017: TSH 1.324 06/28/2017: Hemoglobin 9.6; Magnesium 1.8; Platelets 198 06/30/2017: BUN 11; Creatinine, Ser 0.62; Potassium 3.7; Sodium 137    Lipid Panel No results found for: CHOL, TRIG, HDL, CHOLHDL, VLDL, LDLCALC, LDLDIRECT   Wt Readings from Last 3 Encounters:  07/19/17 179 lb (81.2 kg)  07/01/17 198 lb 3.1 oz (89.9 kg)  06/22/17 187 lb 9.6 oz (85.1 kg)     Other studies Reviewed: Additional studies/ records that were reviewed today include: Hospital records and testing.  ASSESSMENT AND PLAN:  1.  PAF: She is maintaining sinus rhythm on Cardizem  CD 240 mg a day and metoprolol 37.5 mg twice daily.  However, she is significantly bradycardic.  I will decrease the metoprolol down to 25 mg twice daily and see if this helps her heart rate.  Her blood pressure is well controlled.  2.  Chronic anticoagulation: She is having no bleeding issues with the Eliquis.  3.  Dyspnea on exertion: She does not have volume overload by exam.  She is compliant with incentive spirometry and flutter valve to try and improve her lung function.  She has quit smoking.  Continue trying to increase activity.  4.  Lung cancer: Keep follow-up appointments with the surgeons as scheduled.   Current medicines are reviewed at length with the patient today.  The patient does not have concerns regarding medicines.  The following changes have been made: Decrease metoprolol  Labs/ tests ordered today include:  No orders of the defined types were placed in this encounter.    Disposition:   FU with Dr Sallyanne Kuster  Signed, [name], PA-C  07/19/2017 11:02 AM    [place] Phone: [phone]; Fax: [phone]  This note was written with the assistance of speech recognition software. Please excuse any transcriptional errors.

## 2017-07-19 NOTE — Progress Notes (Signed)
Agree with decreasing the metoprolol, thank you MCr

## 2017-07-19 NOTE — Patient Instructions (Signed)
Medication Instructions:  DECREASE Metoprolol 25mg  Take 1 tablet by mouth twice a day   Labwork: None  Testing/Procedures: None   Follow-Up: Your physician recommends that you schedule a follow-up appointment in: 3 months with Dr Sallyanne Kuster  Any Other Special Instructions Will Be Listed Below (If Applicable). If you need a refill on your cardiac medications before your next appointment, please call your pharmacy.

## 2017-07-20 ENCOUNTER — Ambulatory Visit (INDEPENDENT_AMBULATORY_CARE_PROVIDER_SITE_OTHER): Payer: Self-pay | Admitting: Thoracic Surgery (Cardiothoracic Vascular Surgery)

## 2017-07-20 ENCOUNTER — Other Ambulatory Visit: Payer: Self-pay

## 2017-07-20 ENCOUNTER — Ambulatory Visit
Admission: RE | Admit: 2017-07-20 | Discharge: 2017-07-20 | Disposition: A | Discharge status: Home / Self Care | Payer: Medicare HMO | Source: Ambulatory Visit | Attending: Thoracic Surgery (Cardiothoracic Vascular Surgery) | Admitting: Thoracic Surgery (Cardiothoracic Vascular Surgery)

## 2017-07-20 ENCOUNTER — Other Ambulatory Visit: Payer: Self-pay | Admitting: Physician Assistant

## 2017-07-20 ENCOUNTER — Encounter: Payer: Self-pay | Admitting: Thoracic Surgery (Cardiothoracic Vascular Surgery)

## 2017-07-20 VITALS — BP 150/82 | HR 52 | Resp 18 | Ht 68.0 in | Wt 179.0 lb

## 2017-07-20 DIAGNOSIS — C3491 Malignant neoplasm of unspecified part of right bronchus or lung: Secondary | ICD-10-CM

## 2017-07-20 NOTE — Progress Notes (Signed)
[address]       [place] [address]             [phone]     HPI: Mrs. Fogelman returns for scheduled follow-up visit  Killian Ress is a 72 year old woman with a past history of hypertension, hyperlipidemia, tobacco abuse, COPD, 2 previous strokes, expressive aphasia, anxiety, and depression.  Back in August 2018 she was found to have a 5 cm right upper lobe mass with adherence to the chest wall but no bony destruction.  This was hypermetabolic on PET/CT.  Biopsy demonstrated squamous cell carcinoma.  She was treated with concurrent chemoradiation at [facility] and had a good radiographic response.  She was felt to be a high risk candidate for surgery but we did a right thoracotomy and right upper lobectomy with en bloc resection of apical chest wall soft tissue and mediastinal node dissection.  I did not resect ribs.  Pathology showed ypT0, ypN0 complete response.  She has been having some paresthesias in her right breast and right shoulder.  She is not having any respiratory issues.  She is taking Tylenol for pain.  She is using that 1-2 times a day.  Past Medical History:  Diagnosis Date  . Anxiety   . Arthritis   . Asthma   . Cerebrovascular disease   . COPD (chronic obstructive pulmonary disease) ([place])   . Depression   . Hyperlipidemia   . Hypertension   . Skin cancer   . 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 Stroke [facility])    x2    memory,speech affected    Current Outpatient Medications  Medication Sig Dispense Refill  . albuterol (PROVENTIL HFA;VENTOLIN HFA) 108 (90 Base) MCG/ACT inhaler Inhale 1-2 puffs into the lungs every 6 (six) hours as needed for wheezing or shortness of breath.     Marland Kitchen apixaban (ELIQUIS) 5 MG TABS tablet Take 1 tablet (5 mg total) by mouth 2 (two) times daily. 60 tablet 1  . atorvastatin (LIPITOR) 40 MG tablet Take 40 mg by mouth daily.     . cholecalciferol (VITAMIN D) 1000 units tablet Take  1,000 Units by mouth daily.    Marland Kitchen diltiazem (CARDIZEM CD) 240 MG 24 hr capsule Take 1 capsule (240 mg total) by mouth daily. 30 capsule 1  . escitalopram (LEXAPRO) 20 MG tablet Take 20 mg by mouth daily.    Marland Kitchen lisinopril (PRINIVIL,ZESTRIL) 10 MG tablet Take 1 tablet (10 mg total) by mouth daily. 30 tablet 1  . metoprolol tartrate (LOPRESSOR) 25 MG tablet Take 1 tablet (25 mg total) by mouth 2 (two) times daily. 60 tablet 1  . Omega 3 1000 MG CAPS Take 1,000 mg by mouth daily.     . traMADol (ULTRAM) 50 MG tablet Take 50 mg by mouth every 4-6 hours PRN moderate to severe pain. 28 tablet 0  . vitamin B-12 (CYANOCOBALAMIN) 1000 MCG tablet Take 1,000 mcg by mouth daily.     No current facility-administered medications for this visit.     Physical Exam BP (!) 150/82 (BP Location: Left Arm, Patient Position: Sitting, Cuff Size: Large)   Pulse (!) 52   Resp 18   Ht 5\' 8"  (1.727 m)   Wt 179 lb (81.2 kg)   SpO2 98% Comment: RA  BMI 27.58 kg/m  72 year old woman in no acute distress Alert and oriented x3 with no focal deficits Lungs diminished breath sounds on the right  Incision and chest tube sites healing well Cardiac regular rate and rhythm normal S1 and S2  Diagnostic Tests: CHEST  2 VIEW  COMPARISON:  None in PACs  FINDINGS: There is postsurgical volume loss on the right. There is an air in fluid level in the right apex. There is no mediastinal shift. The left lung is well-expanded and clear. The heart and pulmonary vascularity are normal. There is calcification in the wall of the aortic arch. The power port catheter tip projects over the midportion of the SVC.  IMPRESSION: There is an air/fluid level in the upper right hemithorax which is likely postsurgical. There is some pleural fluid laterally and at the right lung base. There is no discrete infiltrate. Given the lack of any previous studies and the patient's current diagnosis and these chest x-ray findings, chest CT  scanning is recommended.  Thoracic aortic atherosclerosis.   Electronically Signed   By: David  Martinique M.D.   On: 07/20/2017 11:15 I personally reviewed the chest x-ray images.  There are postoperative changes on the right.  There is no indication for a CT of the chest at this time.  There are multiple previous studies they can be accessed through epic.  It is unclear to me why the present study comes up in a separate PACS window.  Impression: Mrs. Crow is a 72 year old woman with a past history of heavy tobacco abuse and COPD.  She was found to have [place] 5 cm squamous cell carcinoma of the right upper lobe last fall.  She was treated with neoadjuvant chemoradiation and then underwent surgical resection.  She had a good partial radiographic response.  No residual tumor was found on pathology so she had a complete pathologic response.  Her prognosis is favorable given the complete response to treatment.  She is having some paresthesias.  That is to be expected this early after surgery.  Given her comorbidities I would favor using Tylenol and possibly some nonsteroidals rather than narcotics or something along the lines of gabapentin.  If her symptoms persist gabapentin might be an option.  She has not seen Dr. Bobby Rumpf since her surgery.  I will have my office schedule follow-up appointment for her to see him.  It would be easier for her to do her follow-up at [facility] with Dr. Bobby Rumpf and Dr. Orlene Erm.  I will be happy to see her at any time if I can be of any assistance with her care.  There are no restrictions on her activities at this time, but she should slowly increase her activities to avoid undue discomfort.  Plan: Follow-up with Dr. Young Berry, MD [facility] [phone]

## 2017-07-22 DIAGNOSIS — Z923 Personal history of irradiation: Secondary | ICD-10-CM | POA: Diagnosis not present

## 2017-07-22 DIAGNOSIS — Z85118 Personal history of other malignant neoplasm of bronchus and lung: Secondary | ICD-10-CM | POA: Diagnosis not present

## 2017-07-22 DIAGNOSIS — Z9221 Personal history of antineoplastic chemotherapy: Secondary | ICD-10-CM | POA: Diagnosis not present

## 2017-07-28 ENCOUNTER — Other Ambulatory Visit: Payer: Self-pay | Admitting: Physician Assistant

## 2017-08-03 ENCOUNTER — Other Ambulatory Visit: Payer: Self-pay | Admitting: Physician Assistant

## 2017-08-26 ENCOUNTER — Other Ambulatory Visit: Payer: Self-pay | Admitting: Physician Assistant

## 2017-08-26 NOTE — Telephone Encounter (Signed)
REFILL 

## 2017-09-06 ENCOUNTER — Other Ambulatory Visit: Payer: Self-pay | Admitting: Physician Assistant

## 2017-09-13 ENCOUNTER — Other Ambulatory Visit: Payer: Self-pay | Admitting: Physician Assistant

## 2017-10-25 ENCOUNTER — Encounter: Payer: Self-pay | Admitting: Cardiovascular Disease

## 2017-10-25 ENCOUNTER — Ambulatory Visit: Payer: Medicare HMO | Admitting: Cardiovascular Disease

## 2017-10-25 VITALS — BP 160/92 | HR 72 | Ht 68.0 in | Wt 188.0 lb

## 2017-10-25 DIAGNOSIS — I69320 Aphasia following cerebral infarction: Secondary | ICD-10-CM | POA: Diagnosis not present

## 2017-10-25 DIAGNOSIS — E7800 Pure hypercholesterolemia, unspecified: Secondary | ICD-10-CM

## 2017-10-25 DIAGNOSIS — I480 Paroxysmal atrial fibrillation: Secondary | ICD-10-CM | POA: Diagnosis not present

## 2017-10-25 DIAGNOSIS — J449 Chronic obstructive pulmonary disease, unspecified: Secondary | ICD-10-CM

## 2017-10-25 DIAGNOSIS — Z7901 Long term (current) use of anticoagulants: Secondary | ICD-10-CM | POA: Diagnosis not present

## 2017-10-25 DIAGNOSIS — I10 Essential (primary) hypertension: Secondary | ICD-10-CM | POA: Diagnosis not present

## 2017-10-25 DIAGNOSIS — Z85118 Personal history of other malignant neoplasm of bronchus and lung: Secondary | ICD-10-CM | POA: Diagnosis not present

## 2017-10-25 MED ORDER — LISINOPRIL 20 MG PO TABS: 20.0000 mg | ORAL_TABLET | Freq: Every day | ORAL | 3 refills | Status: DC

## 2017-10-25 NOTE — Patient Instructions (Signed)
Dr Sallyanne Kuster has recommended making the following medication changes: 1. INCREASE Lisinopril to 20 mg daily  Your physician has requested that you regularly monitor your blood pressure at home. Please use the same machine to check your blood pressure daily. Keep a record of your blood pressures using the log sheet provided. In 2-3 weeks, please report your readings back to Dr C. You may use our online patient portal 'MyChart' or you can call the office to speak with a nurse.  Dr Sallyanne Kuster recommends that you schedule a follow-up appointment in 12 months. You will receive a reminder letter in the mail two months in advance. If you don't receive a letter, please call our office to schedule the follow-up appointment.  If you need a refill on your cardiac medications before your next appointment, please call your pharmacy.

## 2017-10-25 NOTE — Progress Notes (Signed)
Cardiology office note:   Patient ID: Krista Grimes; MRN: [id number]; DOB: [birth year]  Primary Care Provider: [name], NP Primary Cardiologist: Sanda Klein, MD  Primary Electrophysiologist:  n/a  Chief Complaint:  F/u Atrial Fibrillation   Patient Profile:   Krista Grimes is a 72 y.o. female with a history of paroxysmal atrial fibrillation initially diagnosed following right upper lobe lobectomy for squamous cell cancer of the lung in early 2019.  She has a history of 2 previous strokes.  History of Present Illness:   Krista Grimes is doing much better than she was earlier this year.  She is continuing to complain of pain in her left breast, especially when her breast moves up and down as she is walking.  She does not have exertional chest pain.  Her exercise tolerance has improved and she does not become short of breath when she mops floors.  She did fall forward when she was bending over to pet her dog, but has not had any other falls that she has had knee injuries.  She denies bleeding.    She does not have orthopnea or PND and denies leg edema.  She is not aware of any palpitations since her last office visit.  She does not explain dizziness or syncope.  She has moderate residual a aphasia since her stroke, but is able to communicate reasonably well with some patience.  Recent lab test showed a hemoglobin of 13.0 normal TSH.  Her lisinopril was reduced months ago when she was started on rate control medications for atrial fibrillation, but her blood pressure is persistently elevated.  On arrival today blood pressure was 160/92, even after relaxing for about 50 minutes her blood pressure remained elevated at 156/90 mmHg.   Past Medical History:  Diagnosis Date  . Anxiety   . Arthritis   . Asthma   . Cerebrovascular disease   . COPD (chronic obstructive pulmonary disease) ([place])   . Depression   . Hyperlipidemia   . Hypertension   . Skin cancer   . Squamous cell  carcinoma of bronchus of right lung ([place])   . Stroke [facility])    x2    memory,speech affected    Past Surgical History:  Procedure Laterality Date  . ABDOMINAL HYSTERECTOMY    . HAND SURGERY    . LOBECTOMY Right 06/24/2017   Procedure: RIGHT UPPER LOBECTOMY;  Surgeon: [name], MD;  Location: [place];  Service: Thoracic;  Laterality: Right;  . port a cath placement  02/2017  . THORACOTOMY Right 06/24/2017   Procedure: THORACOTOMY MAJOR;  Surgeon: [name], MD;  Location: [place];  Service: Thoracic;  Laterality: Right;  . TUBAL LIGATION       Medications Prior to Admission: Prior to Admission medications   Medication Sig Start Date End Date Taking? Authorizing Provider  albuterol (PROVENTIL HFA;VENTOLIN HFA) 108 (90 Base) MCG/ACT inhaler Inhale 1-2 puffs into the lungs every 6 (six) hours as needed for wheezing or shortness of breath.    Yes [provider]  atorvastatin (LIPITOR) 40 MG tablet Take 40 mg by mouth daily.    Yes [provider]  cholecalciferol (VITAMIN D) 1000 units tablet Take 1,000 Units by mouth daily.   Yes [provider]  diltiazem (CARDIZEM CD) 240 MG 24 hr capsule Take 1 capsule (240 mg total) by mouth daily. 07/02/17  Yes Zimmerman, Donielle M, PA-C  ELIQUIS 5 MG TABS tablet TAKE 1 TABLET BY MOUTH TWICE A  DAY 08/26/17  Yes Jissel Slavens, MD  escitalopram (LEXAPRO) 20 MG tablet Take 20 mg by mouth daily.   Yes [provider]  lisinopril (PRINIVIL,ZESTRIL) 10 MG tablet TAKE 1 TABLET BY MOUTH EVERY DAY 08/26/17  Yes Barrett, Rhonda G, PA-C  metoprolol tartrate (LOPRESSOR) 25 MG tablet Take 1 tablet (25 mg total) by mouth 2 (two) times daily. 09/13/17  Yes Ibtisam Benge, MD  Omega 3 1000 MG CAPS Take 1,000 mg by mouth daily.    Yes [provider]  traMADol (ULTRAM) 50 MG tablet Take 50 mg by mouth every 4-6 hours PRN moderate to severe pain. 07/01/17  Yes Lars Pinks M, PA-C  vitamin B-12  (CYANOCOBALAMIN) 1000 MCG tablet Take 1,000 mcg by mouth daily.   Yes [provider]     Allergies:    Allergies  Allergen Reactions  . Codeine Nausea And Vomiting    Social History:   Social History   Socioeconomic History  . Marital status: Married    Spouse name: Not on file  . Number of children: Not on file  . Years of education: Not on file  . Highest education level: Not on file  Occupational History  . Not on file  Social Needs  . Financial resource strain: Not on file  . Food insecurity:    Worry: Not on file    Inability: Not on file  . Transportation needs:    Medical: Not on file    Non-medical: Not on file  Tobacco Use  . Smoking status: Former Smoker    Packs/day: 2.00    Years: 50.00    Pack years: 100.00    Types: Cigarettes    Last attempt to quit: 12/30/2016    Years since quitting: 0.8  . Smokeless tobacco: Never Used  Substance and Sexual Activity  . Alcohol use: No    Frequency: Never  . Drug use: No  . Sexual activity: Not on file  Lifestyle  . Physical activity:    Days per week: Not on file    Minutes per session: Not on file  . Stress: Not on file  Relationships  . Social connections:    Talks on phone: Not on file    Gets together: Not on file    Attends religious service: Not on file    Active member of club or organization: Not on file    Attends meetings of clubs or organizations: Not on file    Relationship status: Not on file  . Intimate partner violence:    Fear of current or ex partner: Not on file    Emotionally abused: Not on file    Physically abused: Not on file    Forced sexual activity: Not on file  Other Topics Concern  . Not on file  Social History Narrative  . Not on file    Family History:   The patient's family history includes COPD in her father; Colon cancer in her mother; Lung cancer in her brother and father; Skin cancer in her brother.    ROS:  Please see the history of present illness.    All other ROS reviewed and negative.     Physical Exam/Data:   Vitals:   10/25/17 0841  BP: (!) 160/92  Pulse: 72  Weight: 188 lb (85.3 kg)  Height: 5\' 8"  (1.727 m)   @IOBRIEF @ Filed Weights   10/25/17 0841  Weight: 188 lb (85.3 kg)   Body mass index is 28.59 kg/m.  General:  Well nourished, well developed, in no acute distress HEENT: normal Lymph: no adenopathy Neck: no JVD Endocrine:  No thryomegaly Vascular: No carotid bruits; FA pulses 2+ bilaterally without bruits  Cardiac:  normal S1, S2; RRR; no murmur  Lungs:  clear to auscultation bilaterally, no wheezing, rhonchi or rales  Abd: soft, nontender, no hepatomegaly  Ext: no edema Musculoskeletal:  No deformities, BUE and BLE strength normal and equal Skin: warm and dry  Neuro:  CNs 2-12 intact, no focal abnormalities noted.  Mild-moderate expressive aphasia Psych:  Normal affect    EKG: Not performed today  Relevant CV Studies: Notes from [name], PA in the office visit with Dr. Roxan Hockey in March  Laboratory Data: Labs from Dr. Allean Found December 10, 2016 Potassium 3.9, creatinine 0.77, normal TSH and LFTs, hemoglobin 13.0 Total cholesterol 137, LDL 76, HDL 43, triglycerides 89 Radiology/Studies:  No results found.  Assessment and Plan:   1. AFib: At increased risk of arrhythmia recurrence due to comorbid conditions.  She has not had any complications with anticoagulation and will continue Eliquis.  On 2 different rate control medications due to difficulty with rate control during her hospitalization.  Her previous appointment she was relatively bradycardic and I would not increase either her beta-blocker or diltiazem.  CHADSVasc 5 (history of stroke, age, gender, HTN).  Reportedly had diabetes but is not requiring any medications for control. 2. Eliquis: Well-tolerated 3. COPD: Some residual dyspnea could be attributed to COPD and lung resection. 4. HTN: We will increase her lisinopril to 20 mg daily and  asked him to send some recordings from the home monitoring couple weeks. 5. HLP: Excellent lipid profile on statin. 6. Lung Ca: She was treated with neoadjuvant chemoradiation and at the time of surgical resection there was no residual tumor. 7. Hx of CVA: Residual aphasia Patient Instructions  Dr Sallyanne Kuster has recommended making the following medication changes: 1. INCREASE Lisinopril to 20 mg daily  Your physician has requested that you regularly monitor your blood pressure at home. Please use the same machine to check your blood pressure daily. Keep a record of your blood pressures using the log sheet provided. In 2-3 weeks, please report your readings back to Dr C. You may use our online patient portal 'MyChart' or you can call the office to speak with a nurse.  Dr Sallyanne Kuster recommends that you schedule a follow-up appointment in 12 months. You will receive a reminder letter in the mail two months in advance. If you don't receive a letter, please call our office to schedule the follow-up appointment.  If you need a refill on your cardiac medications before your next appointment, please call your pharmacy.  For questions or updates, please contact [place] Please consult [email or web address] for contact info under Cardiology/STEMI.    Signed, Sanda Klein, MD  10/25/2017 6:06 PM

## 2017-11-23 DIAGNOSIS — Z9221 Personal history of antineoplastic chemotherapy: Secondary | ICD-10-CM

## 2017-11-23 DIAGNOSIS — Z85118 Personal history of other malignant neoplasm of bronchus and lung: Secondary | ICD-10-CM | POA: Diagnosis not present

## 2017-11-23 DIAGNOSIS — Z902 Acquired absence of lung [part of]: Secondary | ICD-10-CM

## 2017-11-23 DIAGNOSIS — Z923 Personal history of irradiation: Secondary | ICD-10-CM

## 2017-11-23 DIAGNOSIS — N644 Mastodynia: Secondary | ICD-10-CM | POA: Diagnosis not present

## 2017-11-28 ENCOUNTER — Other Ambulatory Visit: Payer: Self-pay | Admitting: Cardiovascular Disease

## 2018-03-29 ENCOUNTER — Other Ambulatory Visit: Payer: Self-pay | Admitting: Cardiovascular Disease

## 2018-09-22 ENCOUNTER — Other Ambulatory Visit: Payer: Self-pay

## 2018-09-22 MED ORDER — METOPROLOL TARTRATE 25 MG PO TABS: 25.0000 mg | ORAL_TABLET | Freq: Two times a day (BID) | ORAL | 3 refills | Status: DC

## 2018-09-30 ENCOUNTER — Telehealth: Payer: Self-pay

## 2018-10-03 MED ORDER — APIXABAN 5 MG PO TABS: 5.0000 mg | ORAL_TABLET | Freq: Two times a day (BID) | ORAL | 1 refills | Status: DC

## 2018-10-03 NOTE — Telephone Encounter (Signed)
72yo, 85.3kg, Scr 0.62 on 06/30/17 - will refill given COVID - note added to recall msg with Dr. Loletha Grayer to obtain when safe Last OV 10/25/17 Indication Afib

## 2018-12-05 ENCOUNTER — Telehealth: Payer: Self-pay | Admitting: *Deleted

## 2018-12-05 NOTE — Telephone Encounter (Signed)
Left a message to confirm her appointment for tomorrow.

## 2018-12-06 ENCOUNTER — Encounter: Payer: Self-pay | Admitting: Cardiovascular Disease

## 2018-12-06 ENCOUNTER — Other Ambulatory Visit: Payer: Self-pay

## 2018-12-06 ENCOUNTER — Telehealth: Payer: Self-pay | Admitting: Cardiovascular Disease

## 2018-12-06 ENCOUNTER — Ambulatory Visit: Payer: Medicare HMO | Admitting: Cardiovascular Disease

## 2018-12-06 VITALS — BP 122/84 | HR 75 | Ht 68.0 in | Wt 213.0 lb

## 2018-12-06 DIAGNOSIS — E7800 Pure hypercholesterolemia, unspecified: Secondary | ICD-10-CM

## 2018-12-06 DIAGNOSIS — I10 Essential (primary) hypertension: Secondary | ICD-10-CM | POA: Diagnosis not present

## 2018-12-06 DIAGNOSIS — I480 Paroxysmal atrial fibrillation: Secondary | ICD-10-CM | POA: Diagnosis not present

## 2018-12-06 DIAGNOSIS — I69320 Aphasia following cerebral infarction: Secondary | ICD-10-CM

## 2018-12-06 DIAGNOSIS — Z7901 Long term (current) use of anticoagulants: Secondary | ICD-10-CM

## 2018-12-06 DIAGNOSIS — Z902 Acquired absence of lung [part of]: Secondary | ICD-10-CM

## 2018-12-06 DIAGNOSIS — Z85118 Personal history of other malignant neoplasm of bronchus and lung: Secondary | ICD-10-CM

## 2018-12-06 MED ORDER — DILTIAZEM HCL ER COATED BEADS 120 MG PO CP24: 120.0000 mg | ORAL_CAPSULE | Freq: Every day | ORAL | 3 refills | Status: AC

## 2018-12-06 NOTE — Progress Notes (Signed)
Cardiology office note:   Patient ID: Krista Grimes; MRN: [id number]; DOB: [birth year]  Primary Care Provider: [name], NP Primary Cardiologist: Sanda Klein, MD  Primary Electrophysiologist:  n/a  Chief Complaint:  F/u Atrial Fibrillation   Patient Profile:   Krista Grimes is a 73 y.o. female with a history of paroxysmal atrial fibrillation initially diagnosed following right upper lobe lobectomy for squamous cell cancer of the lung in early 2019.  She has a history of 2 previous strokes.  History of Present Illness:   Krista Grimes is doing well.  She has not had palpitations or tachycardia.  She denies chest pain but has numbness in her right breast and right arm, ever since her stroke.  She has not had any falls or injuries and other than occasional epistaxis she does not have any bleeding problems.  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    She denies orthopnea, PND, but has bilateral ankle swelling which recently has been quite significant.  She is on chronic diltiazem.  Blood pressure has reportedly been very well controlled.  She has chronic hyperlipidemia and takes atorvastatin, but I cannot find a recent lipid profile (July 2018 lipid parameters were in desirable range).   Past Medical History:  Diagnosis Date  . Anxiety   . Arthritis   . Asthma   . Cerebrovascular disease   . COPD (chronic obstructive pulmonary disease) ([place])   . Depression   . Hyperlipidemia   . Hypertension   . Skin cancer   . Squamous cell carcinoma of bronchus of right lung ([place])   . Stroke [facility])    x2    memory,speech affected    Past Surgical History:  Procedure Laterality Date  . ABDOMINAL HYSTERECTOMY    . HAND SURGERY    . LOBECTOMY Right 06/24/2017   Procedure: RIGHT UPPER LOBECTOMY;  Surgeon: [name], MD;  Location: [facility];  Service: Thoracic;  Laterality: Right;  . port a cath placement  02/2017  . THORACOTOMY Right 06/24/2017    Procedure: THORACOTOMY MAJOR;  Surgeon: [name], MD;  Location: [facility];  Service: Thoracic;  Laterality: Right;  . TUBAL LIGATION       Medications Prior to Admission: Prior to Admission medications   Medication Sig Start Date End Date Taking? Authorizing Provider  albuterol (PROVENTIL HFA;VENTOLIN HFA) 108 (90 Base) MCG/ACT inhaler Inhale 1-2 puffs into the lungs every 6 (six) hours as needed for wheezing or shortness of breath.    Yes [provider]  atorvastatin (LIPITOR) 40 MG tablet Take 40 mg by mouth daily.    Yes [provider]  cholecalciferol (VITAMIN D) 1000 units tablet Take 1,000 Units by mouth daily.   Yes [provider]  diltiazem (CARDIZEM CD) 240 MG 24 hr capsule Take 1 capsule (240 mg total) by mouth daily. 07/02/17  Yes Zimmerman, Donielle M, PA-C  ELIQUIS 5 MG TABS tablet TAKE 1 TABLET BY MOUTH TWICE A DAY 08/26/17  Yes Delorise Hunkele, MD  escitalopram (LEXAPRO) 20 MG tablet Take 20 mg by mouth daily.   Yes [provider]  lisinopril (PRINIVIL,ZESTRIL) 10 MG tablet TAKE 1 TABLET BY MOUTH EVERY DAY 08/26/17  Yes Barrett, Rhonda G, PA-C  metoprolol tartrate (LOPRESSOR) 25 MG tablet Take 1 tablet (25 mg total) by mouth 2 (two) times daily. 09/13/17  Yes Ahlaya Ende, MD  Omega 3 1000 MG CAPS Take 1,000 mg by mouth daily.    Yes [provider]  traMADol (ULTRAM) 50 MG tablet Take 50 mg by mouth every 4-6 hours PRN moderate to severe pain. 07/01/17  Yes Lars Pinks M, PA-C  vitamin B-12 (CYANOCOBALAMIN) 1000 MCG tablet Take 1,000 mcg by mouth daily.   Yes [provider]     Allergies:    Allergies  Allergen Reactions  . Codeine Nausea And Vomiting    Social History:   Social History   Socioeconomic History  . Marital status: Married    Spouse name: Not on file  . Number of children: Not on file  . Years of education: Not on file  . Highest education level: Not on file  Occupational  History  . Not on file  Social Needs  . Financial resource strain: Not on file  . Food insecurity    Worry: Not on file    Inability: Not on file  . Transportation needs    Medical: Not on file    Non-medical: Not on file  Tobacco Use  . Smoking status: Former Smoker    Packs/day: 2.00    Years: 50.00    Pack years: 100.00    Types: Cigarettes    Quit date: 12/30/2016    Years since quitting: 1.9  . Smokeless tobacco: Never Used  Substance and Sexual Activity  . Alcohol use: No    Frequency: Never  . Drug use: No  . Sexual activity: Not on file  Lifestyle  . Physical activity    Days per week: Not on file    Minutes per session: Not on file  . Stress: Not on file  Relationships  . Social Herbalist on phone: Not on file    Gets together: Not on file    Attends religious service: Not on file    Active member of club or organization: Not on file    Attends meetings of clubs or organizations: Not on file    Relationship status: Not on file  . Intimate partner violence    Fear of current or ex partner: Not on file    Emotionally abused: Not on file    Physically abused: Not on file    Forced sexual activity: Not on file  Other Topics Concern  . Not on file  Social History Narrative  . Not on file    Family History:   The patient's family history includes COPD in her father; Colon cancer in her mother; Lung cancer in her brother and father; Skin cancer in her brother.    ROS:  Please see the history of present illness.  All other systems reviewed and are negative  Physical Exam/Data:   Vitals:   12/06/18 1548  BP: 122/84  Pulse: 75  Weight: 213 lb (96.6 kg)  Height: 5\' 8"  (1.727 m)   @IOBRIEF @ Filed Weights   12/06/18 1548  Weight: 213 lb (96.6 kg)   Body mass index is 32.39 kg/m.   General: Alert, oriented x3, no distress, obese Head: no evidence of trauma, PERRL, EOMI, no exophtalmos or lid lag, no myxedema, no xanthelasma; normal ears,  nose and oropharynx Neck: normal jugular venous pulsations and no hepatojugular reflux; brisk carotid pulses without delay and no carotid bruits Chest: clear to auscultation, no signs of consolidation by percussion or palpation, normal fremitus, symmetrical and full respiratory excursions Cardiovascular: normal position and quality of the apical impulse, regular rhythm, normal first and second heart sounds, no murmurs, rubs or gallops Abdomen: no tenderness or distention,  no masses by palpation, no abnormal pulsatility or arterial bruits, normal bowel sounds, no hepatosplenomegaly Extremities: no clubbing, cyanosis or edema; 2+ radial, ulnar and brachial pulses bilaterally; 2+ right femoral, posterior tibial and dorsalis pedis pulses; 2+ left femoral, posterior tibial and dorsalis pedis pulses; no subclavian or femoral bruits Neurological: Moderate expressive aphasia, otherwise grossly nonfocal Psych: Normal mood and affect    EKG: is ordered today and shows sinus rhythm with mild nonspecific ST segment scooping mostly seen in the inferior leads.  Otherwise normal.  Relevant CV Studies: Notes from [name], PA in the office visit with Dr. Roxan Hockey in March  Laboratory Data: Labs from Dr. Allean Found December 10, 2016 Potassium 3.9, creatinine 0.77, normal TSH and LFTs, hemoglobin 13.0 Total cholesterol 137, LDL 76, HDL 43, triglycerides 89 Radiology/Studies:  No results found.  Assessment and Plan:   1. AFib: No clinically evident events.  We will reduce the dose of diltiazem to 120 mg daily since she has substantial ankle edema.  CHADSVasc 5 (history of stroke, age, gender, HTN).  Reportedly had diabetes but is not requiring any medications for control. 2. Eliquis: Well-tolerated, no serious bleeding complications 3. COPD: No longer complains of dyspnea, but this was not be surprising since she has had lung resection and has underlying emphysema 4. HTN: Well-controlled.  If the blood  pressure increases after reducing the dose of diltiazem can increase the lisinopril.. 5. HLP: On statin; time for repeat lipid profile.  Should like to have it drawn at her PCPs office. 6. Lung Ca: She was treated with neoadjuvant chemoradiation and at the time of surgical resection there was no residual tumor. 7. Hx of CVA: Residual aphasia Patient Instructions  Medication Instructions:  DECREASE the Diltiazem to 120 mg once daily  If you need a refill on your cardiac medications before your next appointment, please call your pharmacy.   Lab work: Your provider would like for you to have the following labs drawn. They may be done at your PCP: Fasting Lipid, CBC and CMET  If you have labs (blood work) drawn today and your tests are completely normal, you will receive your results only by: Marland Kitchen MyChart Message (if you have MyChart) OR . A paper copy in the mail If you have any lab test that is abnormal or we need to change your treatment, we will call you to review the results.  Testing/Procedures: None ordered  Follow-Up: At [facility], you and your health needs are our priority.  As part of our continuing mission to provide you with exceptional heart care, we have created designated Provider Care Teams.  These Care Teams include your primary Cardiologist (physician) and Advanced Practice Providers (APPs -  Physician Assistants and Nurse Practitioners) who all work together to provide you with the care you need, when you need it. You will need a follow up appointment in 12 months.  Please call our office 2 months in advance to schedule this appointment.  You may see Sanda Klein, MD or one of the following Advanced Practice Providers on your designated Care Team: [place], [place] . Fabian Sharp, PA-C     For questions or updates, please contact [place] Please consult [email or web address] for contact info under Cardiology/STEMI.    Signed, Sanda Klein, MD  12/06/2018 4:10 PM

## 2018-12-06 NOTE — Patient Instructions (Signed)
Medication Instructions:  DECREASE the Diltiazem to 120 mg once daily  If you need a refill on your cardiac medications before your next appointment, please call your pharmacy.   Lab work: Your provider would like for you to have the following labs drawn. They may be done at your PCP: Fasting Lipid, CBC and CMET  If you have labs (blood work) drawn today and your tests are completely normal, you will receive your results only by: Marland Kitchen MyChart Message (if you have MyChart) OR . A paper copy in the mail If you have any lab test that is abnormal or we need to change your treatment, we will call you to review the results.  Testing/Procedures: None ordered  Follow-Up: At [facility], you and your health needs are our priority.  As part of our continuing mission to provide you with exceptional heart care, we have created designated Provider Care Teams.  These Care Teams include your primary Cardiologist (physician) and Advanced Practice Providers (APPs -  Physician Assistants and Nurse Practitioners) who all work together to provide you with the care you need, when you need it. You will need a follow up appointment in 12 months.  Please call our office 2 months in advance to schedule this appointment.  You may see Sanda Klein, MD or one of the following Advanced Practice Providers on your designated Care Team: [place], [place] . Fabian Sharp, PA-C

## 2018-12-06 NOTE — Telephone Encounter (Signed)
I called pt to confirm appt        COVID-19 Pre-Screening Questions:   In the past 7 to 10 days have you had a cough,  shortness of breath, headache, congestion, fever (100 or greater) body aches, chills, sore throat, or sudden loss of taste or sense of smell? no  Have you been around anyone with known Covid 19.  Have you been around anyone who is awaiting Covid 19 test results in the past 7 to 10 days? no  Have you been around anyone who has been exposed to Covid 19, or has mentioned symptoms of Covid 19 within the past 7 to 10 days? no  If you have any concerns/questions about symptoms patients report during screening (either on the phone or at threshold). Contact the provider seeing the patient or DOD for further guidance.  If neither are available contact a member of the leadership team.

## 2019-01-25 ENCOUNTER — Telehealth: Payer: Self-pay

## 2019-01-25 MED ORDER — LISINOPRIL 20 MG PO TABS: 20.0000 mg | ORAL_TABLET | Freq: Every day | ORAL | 2 refills | Status: DC

## 2019-01-25 NOTE — Telephone Encounter (Signed)
Requested Prescriptions   Signed Prescriptions Disp Refills  . lisinopril (ZESTRIL) 20 MG tablet 90 tablet 2    Sig: Take 1 tablet (20 mg total) by mouth daily.    Authorizing Provider: Sanda Klein    Ordering User: Raelene Bott, Avontae Burkhead L

## 2019-01-30 IMAGING — DX DG CHEST 1V PORT
1 series · 1 of 1 positions shown · non-contrast
Comparison: June 22, 2017

CLINICAL DATA: Status post right-sided thoracotomy for right-sided
lung carcinoma

EXAM:
PORTABLE CHEST 1 VIEW

[chest ap]
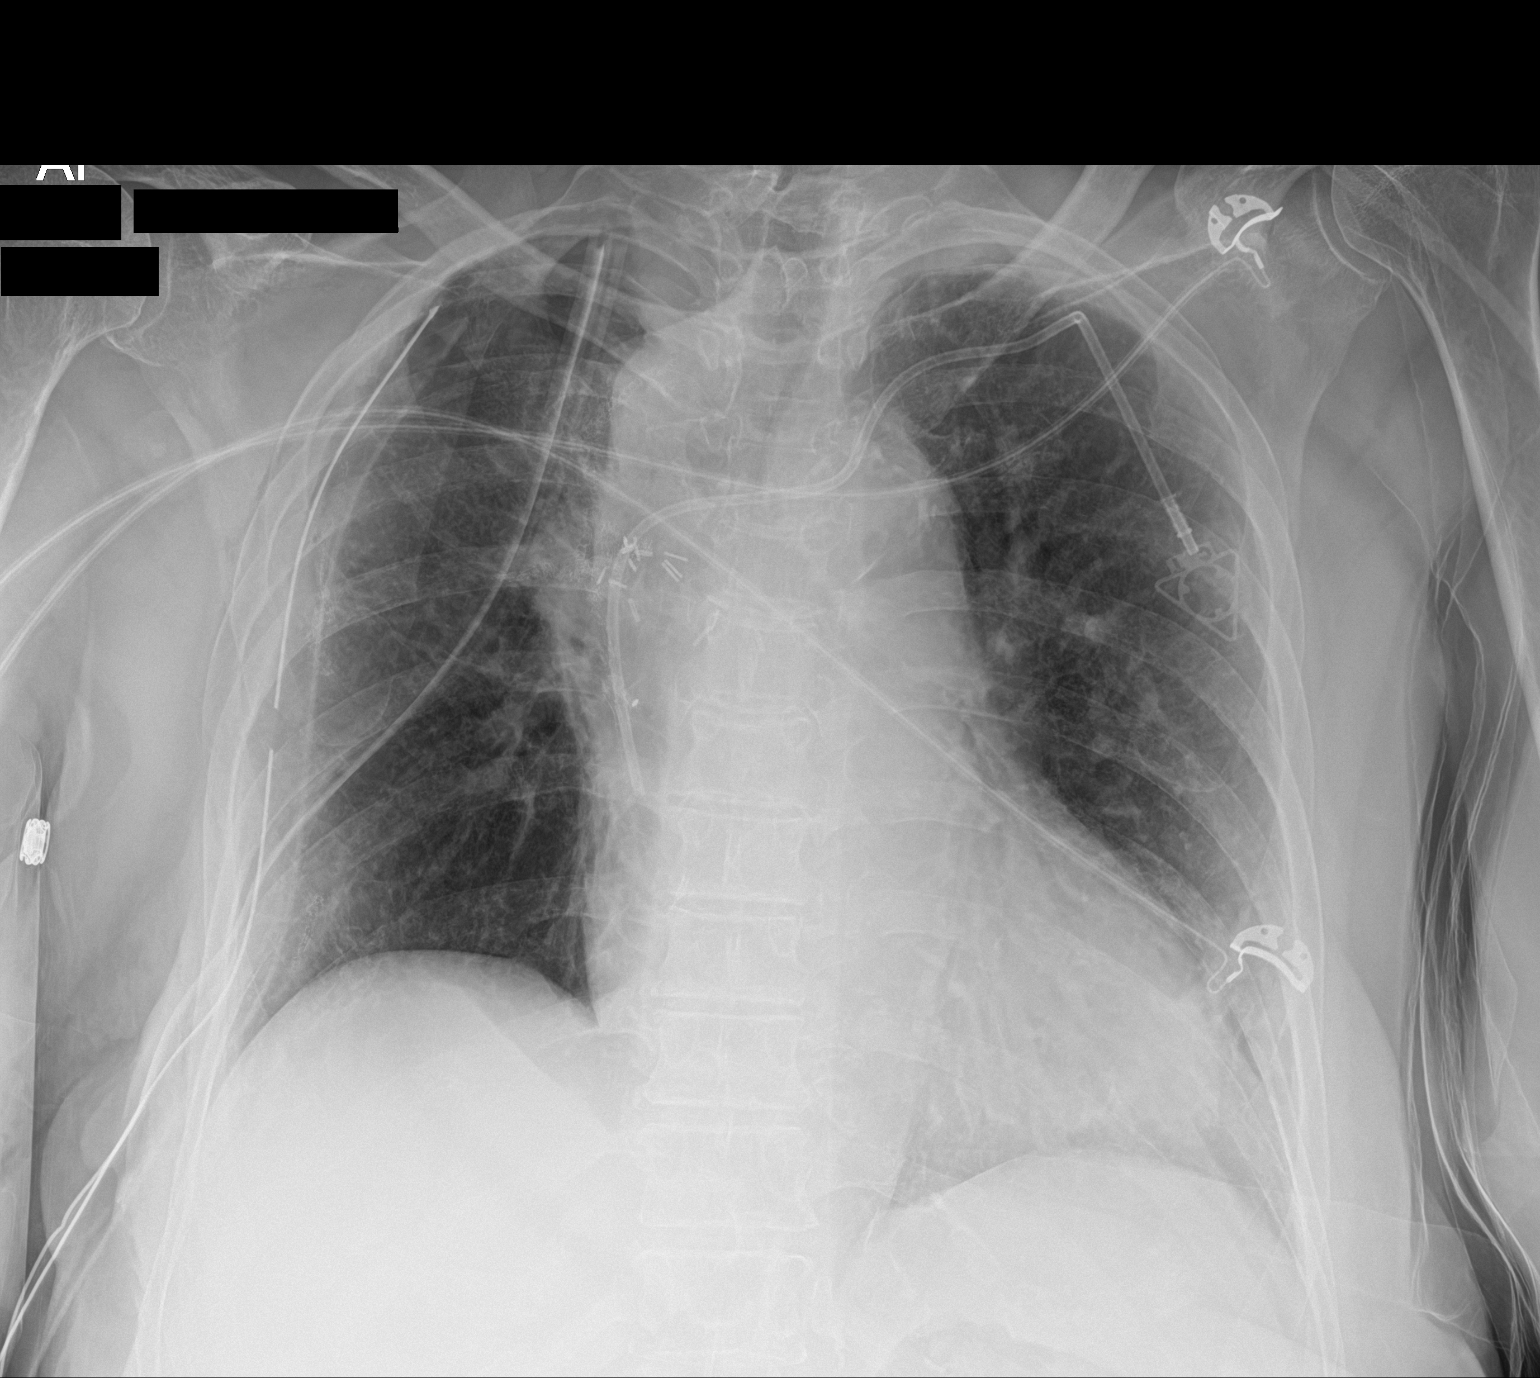

[1 of 1 positions shown; findings below may reference images not displayed]

FINDINGS: There is postoperative change in the right upper lobe with volume
loss. There are chest tubes on the right without evident
pneumothorax. Left lung is clear. Heart is upper normal in size with
pulmonary vascularity within normal limits. Port-A-Cath tip is in
the superior vena cava. No adenopathy. There is aortic
atherosclerosis.
IMPRESSION: Postoperative change on the right with right upper lobe volume loss.
Chest tubes noted on right side without pneumothorax. No edema or
consolidation. There is aortic atherosclerosis. Stable cardiac
silhouette. Port-A-Cath tip in superior vena cava.

Aortic Atherosclerosis ([id number]).

## 2019-02-01 IMAGING — DX DG CHEST 1V PORT
1 series · 1 of 1 positions shown · non-contrast
Comparison: Yesterday

CLINICAL DATA: Chest tube in place

EXAM:
PORTABLE CHEST 1 VIEW

[chest ap]
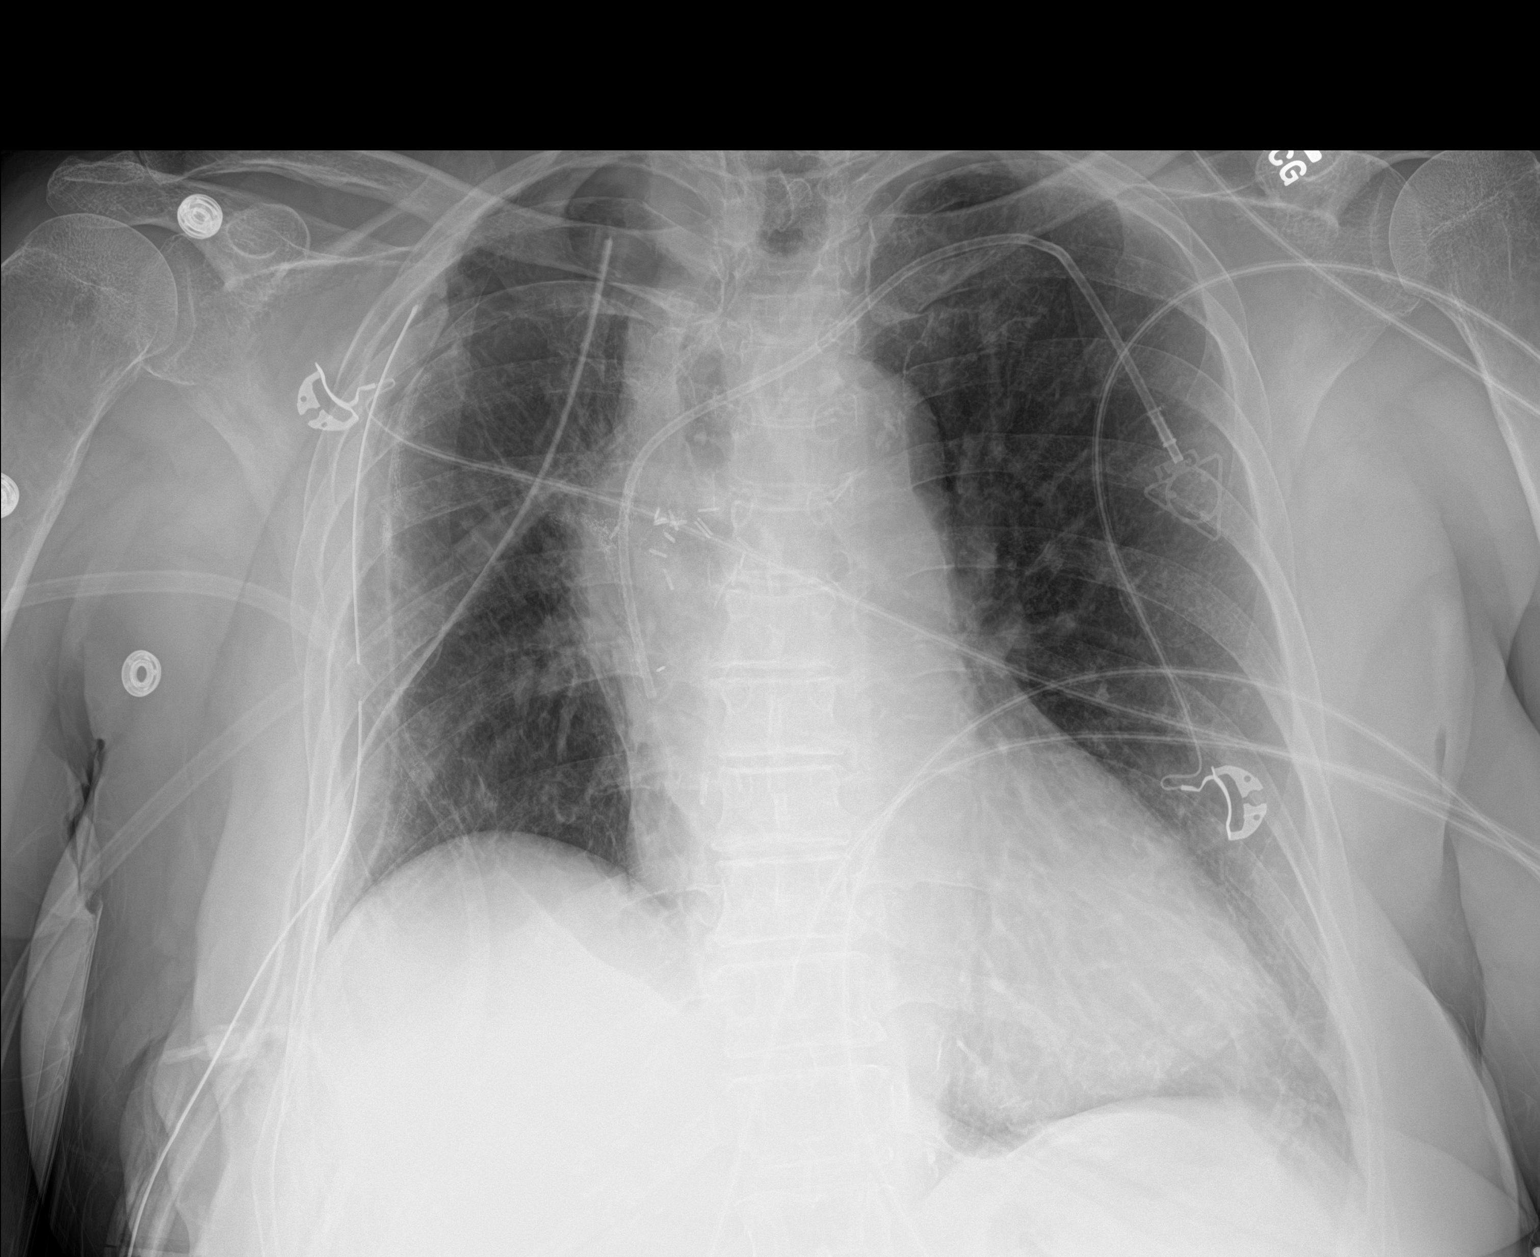

[1 of 1 positions shown; findings below may reference images not displayed]

FINDINGS: Patient positioning appears similar to previous.Right apical
pneumothorax that is 3 rib interspaces tall compared to 2 yesterday.
Right-sided chest tube in stable position. Postoperative volume loss
on the right with hilar and pulmonary clips/sutures. Clear,
hyperinflated left lung. Cardiomegaly.
IMPRESSION: Mild increase in small right apical pneumothorax.

## 2019-02-02 IMAGING — DX DG CHEST 1V PORT
1 series · 1 of 1 positions shown · non-contrast
Comparison: 06/26/2017 and prior exams

CLINICAL DATA: Status post right thoracotomy/lobectomy.

EXAM:
PORTABLE CHEST 1 VIEW

[chest ap]
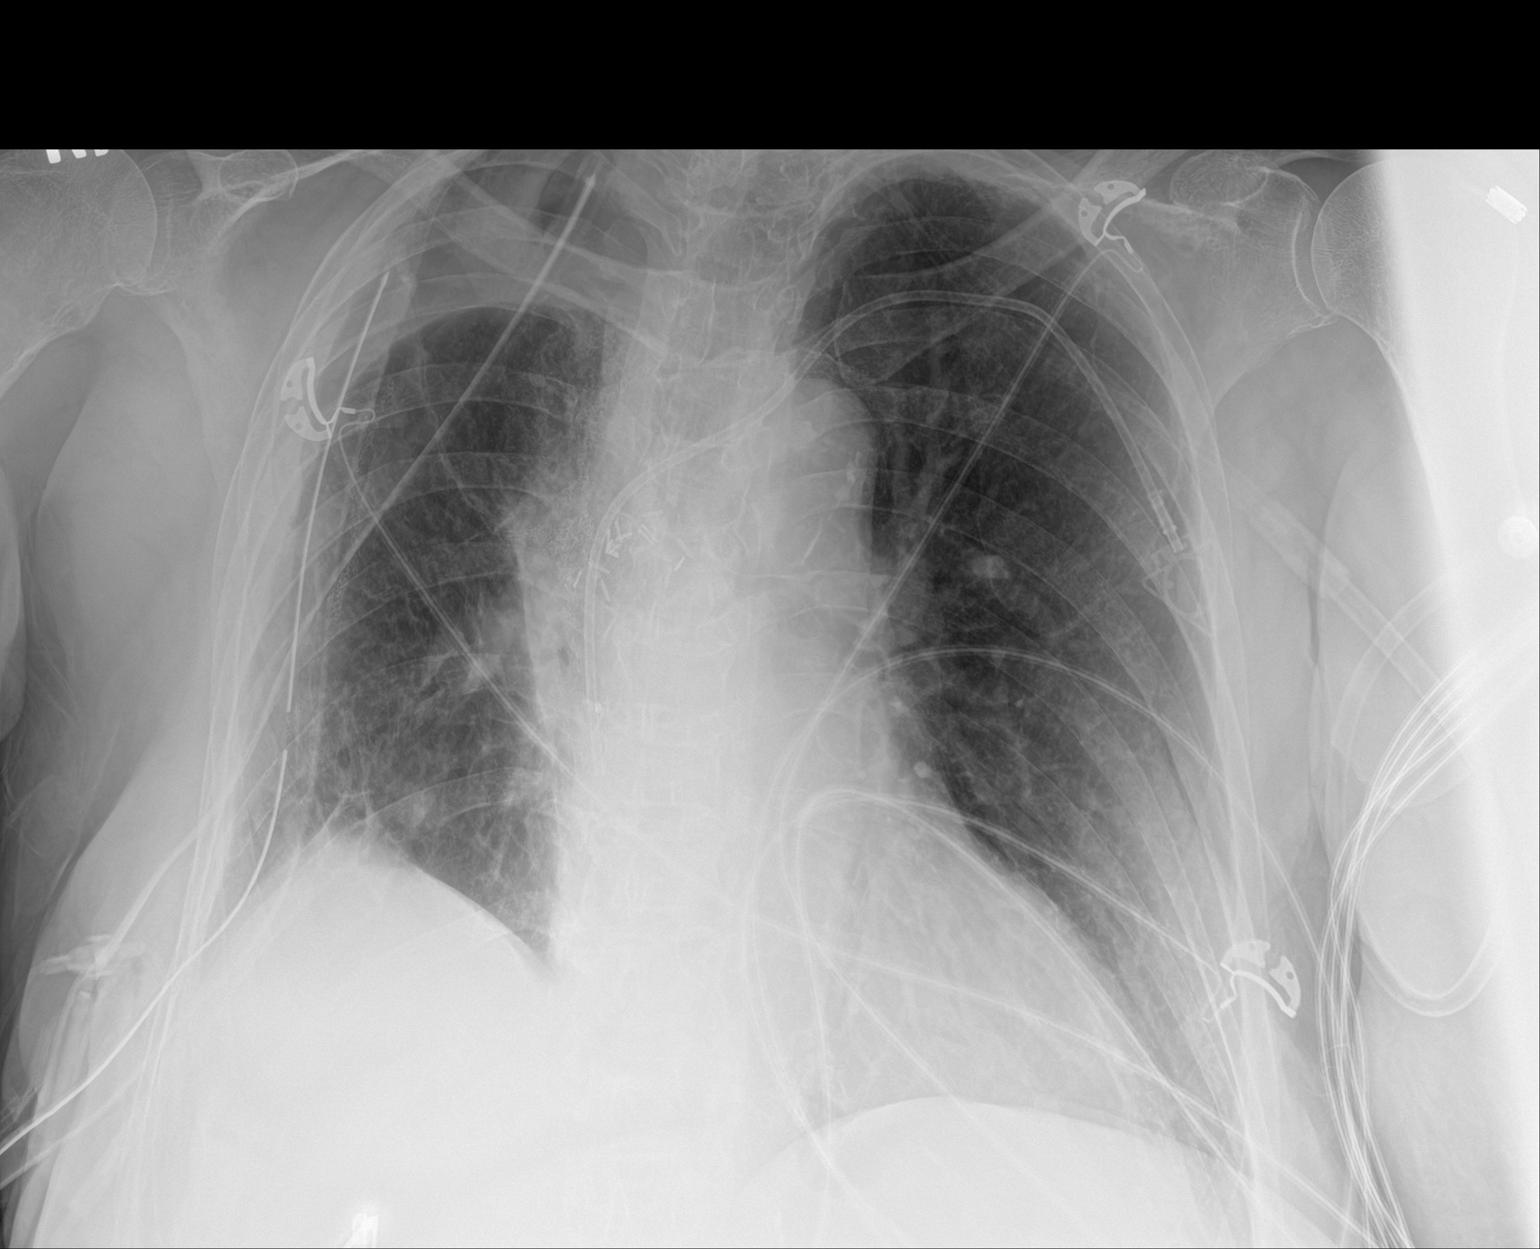

[1 of 1 positions shown; findings below may reference images not displayed]

FINDINGS: Right thoracotomy/lobectomy changes are again noted.

Two right thoracostomy tubes are unchanged with tips overlying the
apex.

Haziness overlying the right apical pleural space is compatible with
hydropneumothorax. The size of the apical pneumothorax remains
unchanged..

A left Port-A-Cath with tip overlying the superior cavoatrial
junction is again noted.

No other significant changes.
IMPRESSION: Right pleural fluid now noted with unchanged size of right apical
pneumothorax. No other significant change.

## 2019-02-05 IMAGING — CR DG CHEST 2V
2 series · 2 of 2 positions shown · non-contrast
Comparison: 06/29/2017

CLINICAL DATA: Shortness of breath. Status post right upper
lobectomy.

EXAM:
CHEST  2 VIEW

[chest pa]
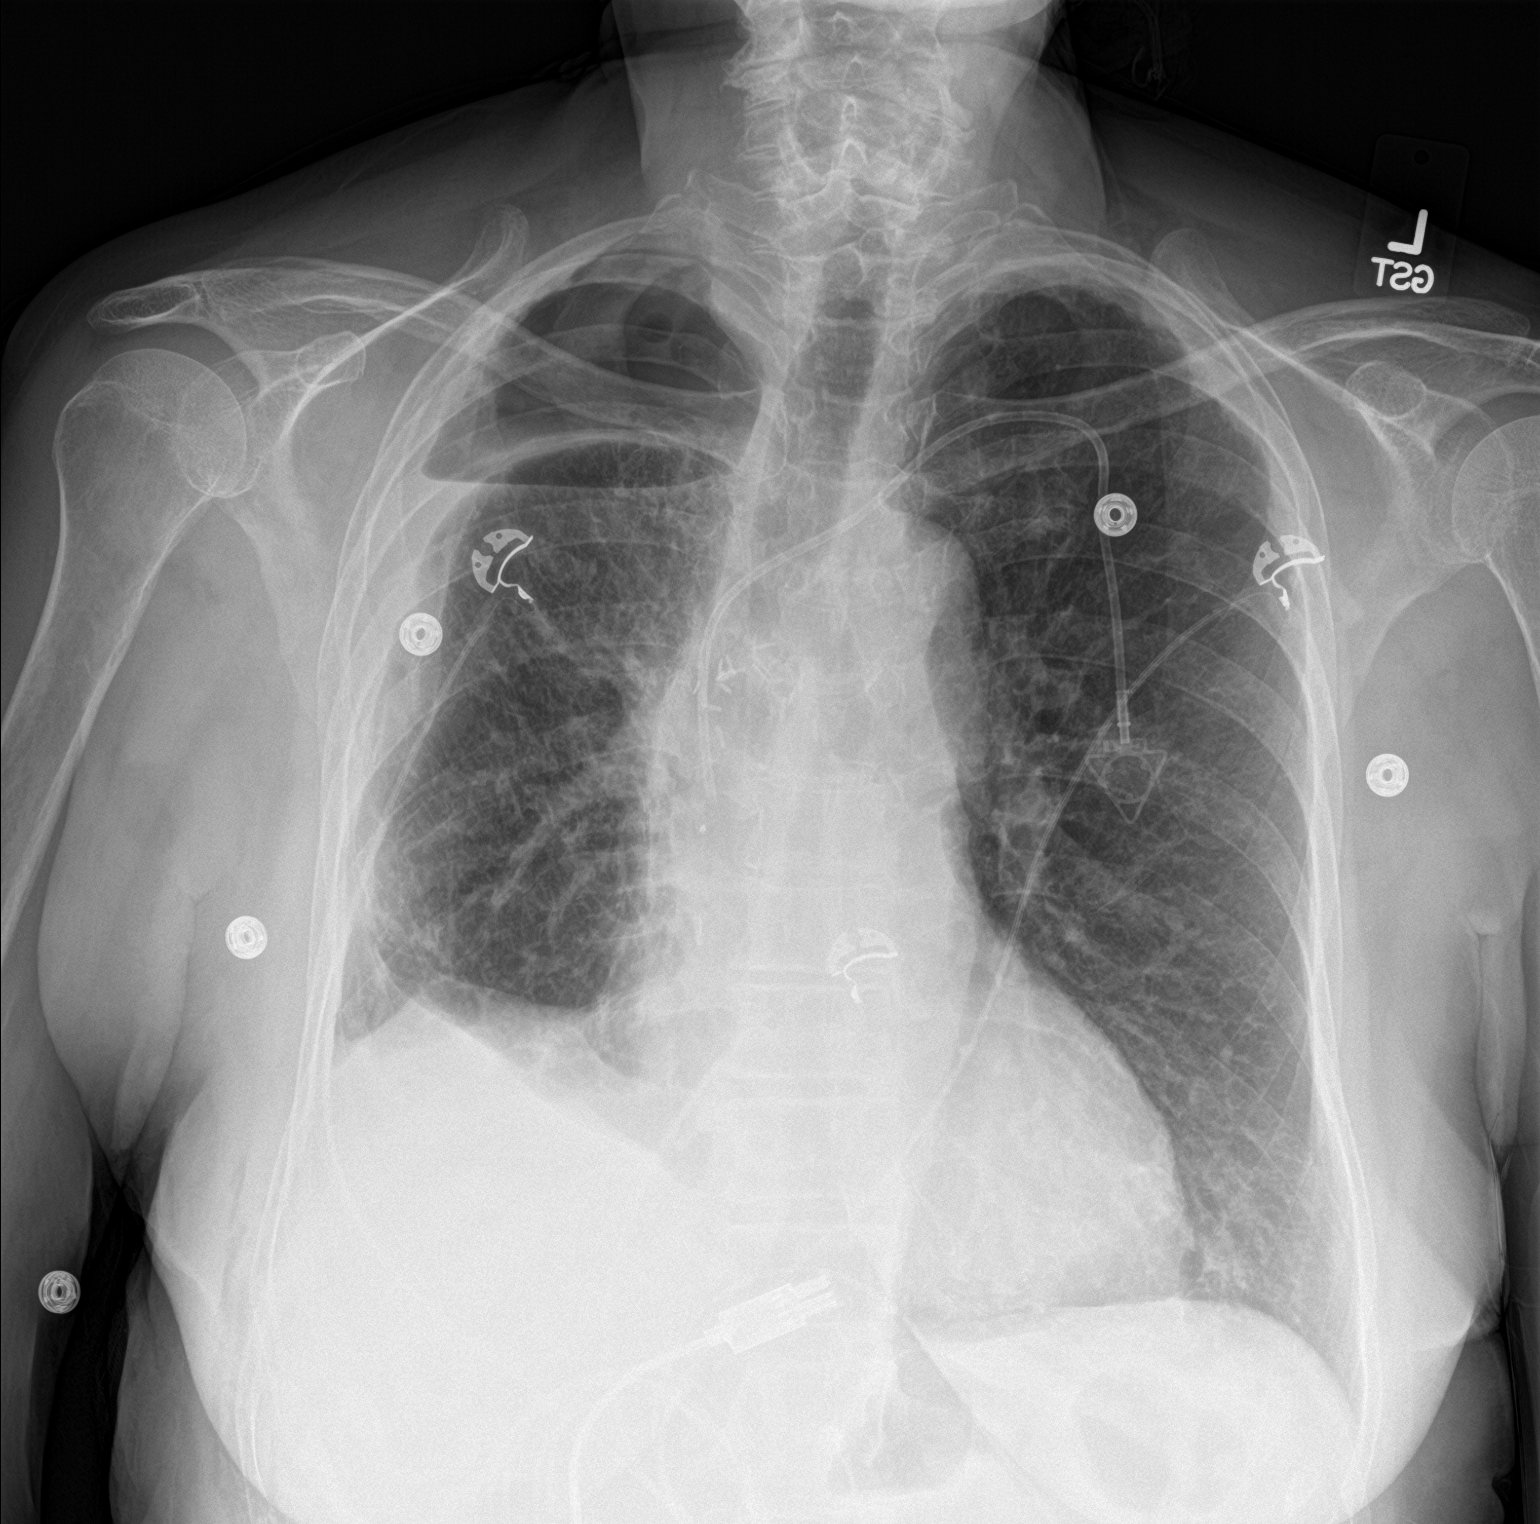

[chest lat]
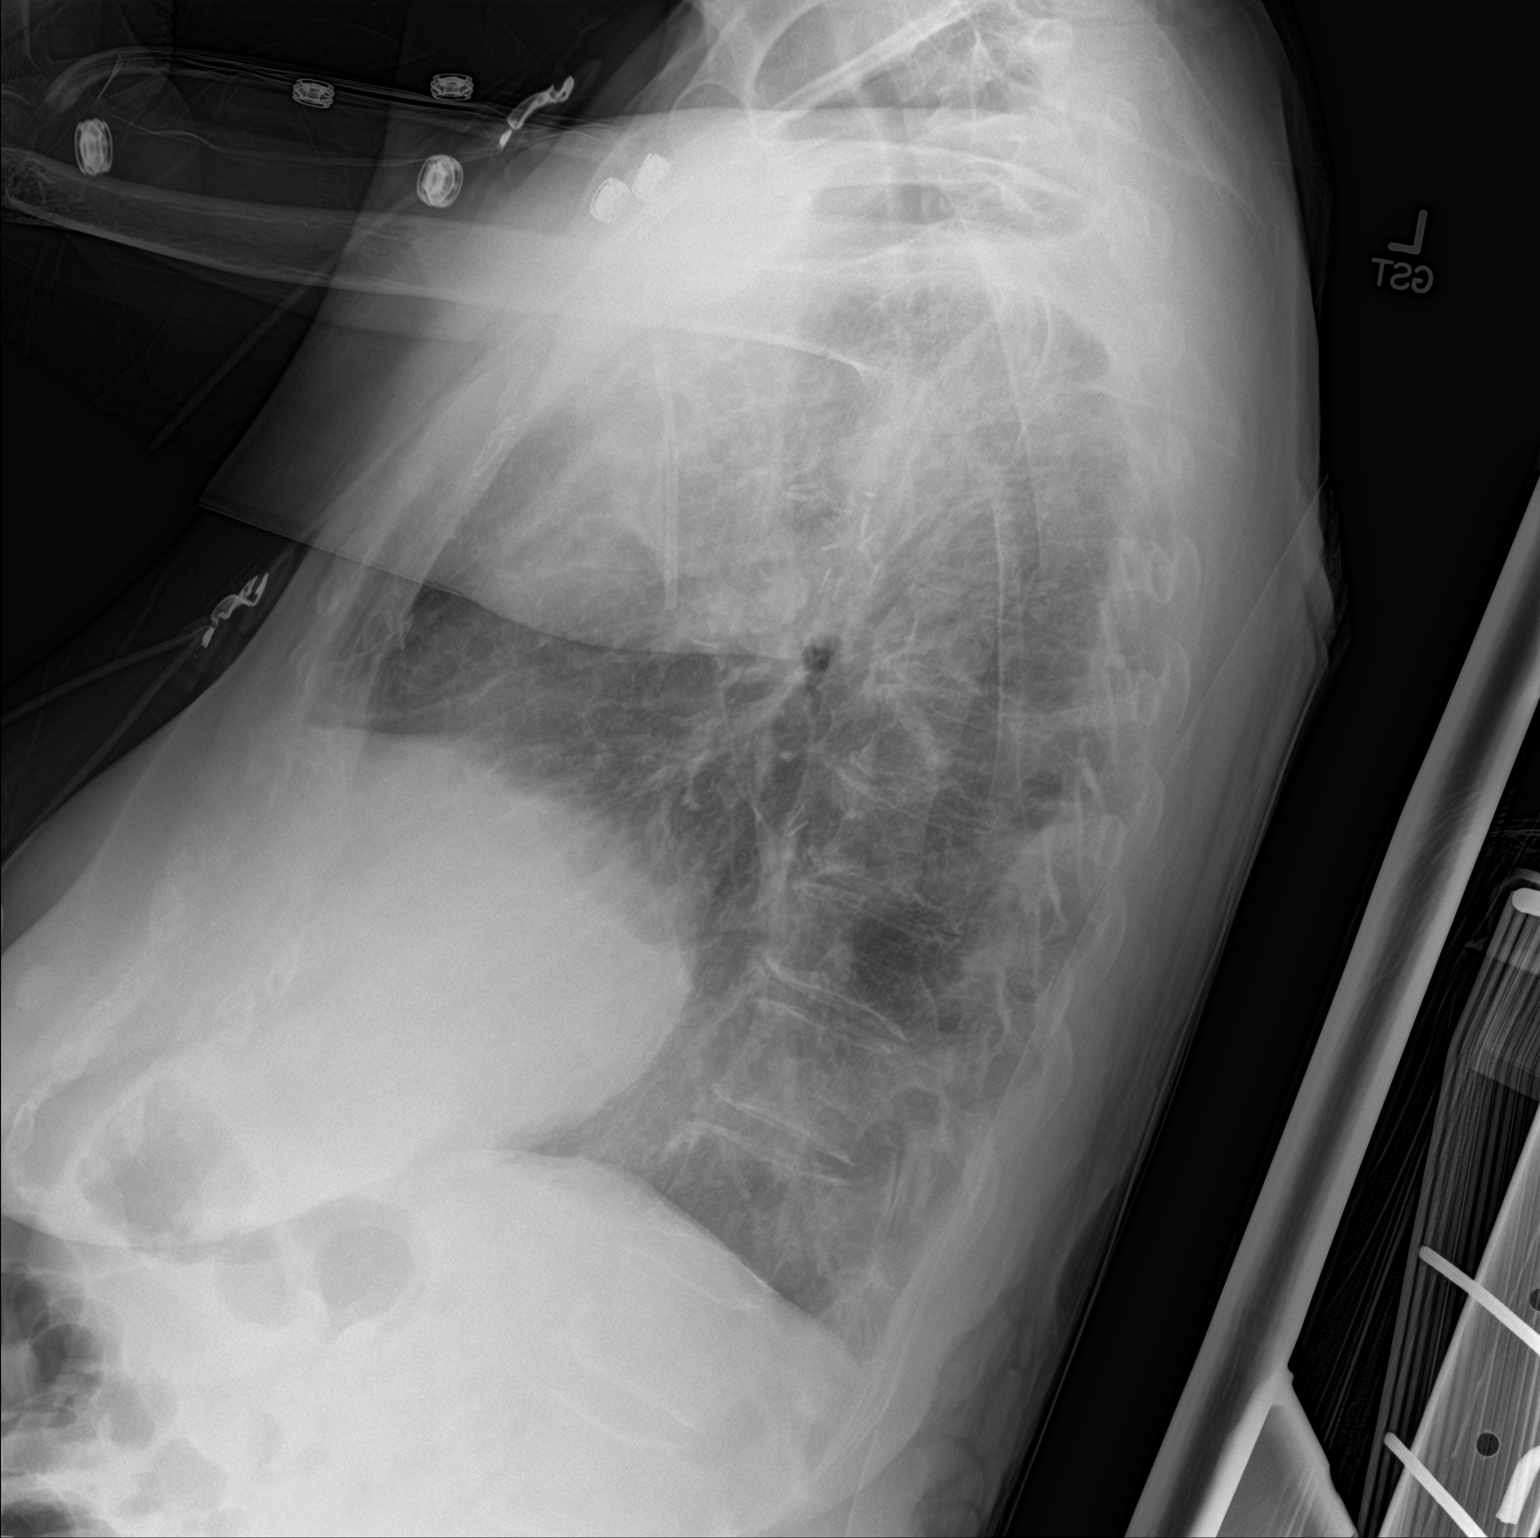

[2 of 2 positions shown; findings below may reference images not displayed]

FINDINGS: Left subclavian Port-A-Cath terminates over the mid SVC. The cardiac
silhouette is normal in size. Sequelae of right upper lobectomy are
again identified. Moderate-sized right pneumothorax is similar to
the prior study. There is a persistent small right pleural effusion.
Right lung base aeration has mildly improved. No acute consolidation
is seen in the left lung.
IMPRESSION: 1. Similar appearance of right hydropneumothorax status post upper
lobectomy.
2. Mildly improved right lung base aeration.

## 2019-03-26 ENCOUNTER — Other Ambulatory Visit: Payer: Self-pay | Admitting: Cardiovascular Disease

## 2019-05-10 DIAGNOSIS — C3411 Malignant neoplasm of upper lobe, right bronchus or lung: Secondary | ICD-10-CM | POA: Diagnosis not present

## 2019-09-10 ENCOUNTER — Other Ambulatory Visit: Payer: Self-pay | Admitting: Cardiovascular Disease

## 2019-10-20 ENCOUNTER — Other Ambulatory Visit: Payer: Self-pay | Admitting: Cardiovascular Disease

## 2019-10-20 DIAGNOSIS — I639 Cerebral infarction, unspecified: Secondary | ICD-10-CM | POA: Diagnosis not present

## 2019-10-20 DIAGNOSIS — J449 Chronic obstructive pulmonary disease, unspecified: Secondary | ICD-10-CM | POA: Diagnosis not present

## 2019-10-20 DIAGNOSIS — E876 Hypokalemia: Secondary | ICD-10-CM | POA: Diagnosis not present

## 2019-10-21 DIAGNOSIS — E876 Hypokalemia: Secondary | ICD-10-CM | POA: Diagnosis not present

## 2019-10-21 DIAGNOSIS — I639 Cerebral infarction, unspecified: Secondary | ICD-10-CM | POA: Diagnosis not present

## 2019-10-21 DIAGNOSIS — J449 Chronic obstructive pulmonary disease, unspecified: Secondary | ICD-10-CM | POA: Diagnosis not present

## 2019-10-22 DIAGNOSIS — I639 Cerebral infarction, unspecified: Secondary | ICD-10-CM | POA: Diagnosis not present

## 2019-10-22 DIAGNOSIS — J449 Chronic obstructive pulmonary disease, unspecified: Secondary | ICD-10-CM | POA: Diagnosis not present

## 2019-10-22 DIAGNOSIS — E876 Hypokalemia: Secondary | ICD-10-CM | POA: Diagnosis not present

## 2019-10-23 DIAGNOSIS — J449 Chronic obstructive pulmonary disease, unspecified: Secondary | ICD-10-CM | POA: Diagnosis not present

## 2019-10-23 DIAGNOSIS — I639 Cerebral infarction, unspecified: Secondary | ICD-10-CM | POA: Diagnosis not present

## 2019-10-23 DIAGNOSIS — E876 Hypokalemia: Secondary | ICD-10-CM | POA: Diagnosis not present

## 2019-10-24 DIAGNOSIS — E876 Hypokalemia: Secondary | ICD-10-CM | POA: Diagnosis not present

## 2019-10-24 DIAGNOSIS — J449 Chronic obstructive pulmonary disease, unspecified: Secondary | ICD-10-CM | POA: Diagnosis not present

## 2019-10-24 DIAGNOSIS — I639 Cerebral infarction, unspecified: Secondary | ICD-10-CM | POA: Diagnosis not present

## 2019-10-25 DIAGNOSIS — I639 Cerebral infarction, unspecified: Secondary | ICD-10-CM | POA: Diagnosis not present

## 2019-10-25 DIAGNOSIS — J449 Chronic obstructive pulmonary disease, unspecified: Secondary | ICD-10-CM | POA: Diagnosis not present

## 2019-10-25 DIAGNOSIS — E876 Hypokalemia: Secondary | ICD-10-CM | POA: Diagnosis not present

## 2019-10-26 DIAGNOSIS — E876 Hypokalemia: Secondary | ICD-10-CM | POA: Diagnosis not present

## 2019-10-26 DIAGNOSIS — J449 Chronic obstructive pulmonary disease, unspecified: Secondary | ICD-10-CM | POA: Diagnosis not present

## 2019-10-26 DIAGNOSIS — I639 Cerebral infarction, unspecified: Secondary | ICD-10-CM | POA: Diagnosis not present

## 2019-11-19 ENCOUNTER — Other Ambulatory Visit: Payer: Self-pay | Admitting: Cardiovascular Disease

## 2020-01-15 ENCOUNTER — Other Ambulatory Visit: Payer: Self-pay | Admitting: Cardiovascular Disease

## 2020-11-30 ENCOUNTER — Other Ambulatory Visit: Payer: Self-pay | Admitting: Cardiovascular Disease

## 2020-12-04 ENCOUNTER — Other Ambulatory Visit: Payer: Self-pay | Admitting: Cardiovascular Disease

## 2021-10-16 DEATH — deceased
